# Patient Record
Sex: Female | Born: 1970 | Race: White | Hispanic: No | Marital: Married | Smoking: Never smoker
Health system: Southern US, Community
[De-identification: ages and names within clinical notes are randomized; demographics above are authoritative.]

## PROBLEM LIST (undated history)

## (undated) DIAGNOSIS — E669 Obesity, unspecified: Secondary | ICD-10-CM

## (undated) DIAGNOSIS — E049 Nontoxic goiter, unspecified: Secondary | ICD-10-CM

## (undated) DIAGNOSIS — E063 Autoimmune thyroiditis: Secondary | ICD-10-CM

## (undated) DIAGNOSIS — E7801 Familial hypercholesterolemia: Secondary | ICD-10-CM

## (undated) DIAGNOSIS — R5383 Other fatigue: Secondary | ICD-10-CM

## (undated) DIAGNOSIS — R7303 Prediabetes: Secondary | ICD-10-CM

## (undated) DIAGNOSIS — M549 Dorsalgia, unspecified: Secondary | ICD-10-CM

## (undated) DIAGNOSIS — R1013 Epigastric pain: Secondary | ICD-10-CM

## (undated) HISTORY — DX: Other fatigue: R53.83

## (undated) HISTORY — DX: Prediabetes: R73.03

## (undated) HISTORY — DX: Autoimmune thyroiditis: E06.3

## (undated) HISTORY — DX: Epigastric pain: R10.13

## (undated) HISTORY — PX: DILATION AND CURETTAGE OF UTERUS: SHX78

## (undated) HISTORY — DX: Familial hypercholesterolemia: E78.01

## (undated) HISTORY — DX: Nontoxic goiter, unspecified: E04.9

## (undated) HISTORY — DX: Dorsalgia, unspecified: M54.9

## (undated) HISTORY — PX: NOSE SURGERY: SHX723

## (undated) HISTORY — DX: Obesity, unspecified: E66.9

---

## 2004-01-01 ENCOUNTER — Encounter: Admission: RE | Admit: 2004-01-01 | Discharge: 2004-03-31 | Payer: Self-pay | Admitting: Internal Medicine

## 2007-05-21 ENCOUNTER — Encounter: Admission: RE | Admit: 2007-05-21 | Discharge: 2007-05-21 | Payer: Self-pay | Admitting: "Endocrinology

## 2007-05-21 ENCOUNTER — Ambulatory Visit: Payer: Self-pay | Admitting: "Endocrinology

## 2007-09-01 ENCOUNTER — Ambulatory Visit: Payer: Self-pay | Admitting: "Endocrinology

## 2007-12-30 ENCOUNTER — Ambulatory Visit: Payer: Self-pay | Admitting: "Endocrinology

## 2008-05-24 ENCOUNTER — Ambulatory Visit: Payer: Self-pay | Admitting: "Endocrinology

## 2008-10-03 ENCOUNTER — Ambulatory Visit: Payer: Self-pay | Admitting: "Endocrinology

## 2009-03-21 ENCOUNTER — Ambulatory Visit: Payer: Self-pay | Admitting: "Endocrinology

## 2009-07-12 ENCOUNTER — Ambulatory Visit: Payer: Self-pay | Admitting: "Endocrinology

## 2009-11-29 ENCOUNTER — Ambulatory Visit: Payer: Self-pay | Admitting: "Endocrinology

## 2010-03-11 ENCOUNTER — Ambulatory Visit: Payer: Self-pay | Admitting: "Endocrinology

## 2010-07-26 ENCOUNTER — Ambulatory Visit: Payer: Self-pay | Admitting: "Endocrinology

## 2010-11-26 ENCOUNTER — Encounter: Payer: Self-pay | Admitting: *Deleted

## 2010-11-26 ENCOUNTER — Other Ambulatory Visit: Payer: Self-pay | Admitting: *Deleted

## 2010-11-26 DIAGNOSIS — R7303 Prediabetes: Secondary | ICD-10-CM | POA: Insufficient documentation

## 2010-11-26 DIAGNOSIS — E669 Obesity, unspecified: Secondary | ICD-10-CM

## 2010-11-26 DIAGNOSIS — E038 Other specified hypothyroidism: Secondary | ICD-10-CM

## 2010-12-19 ENCOUNTER — Encounter: Payer: Self-pay | Admitting: "Endocrinology

## 2010-12-19 ENCOUNTER — Ambulatory Visit (INDEPENDENT_AMBULATORY_CARE_PROVIDER_SITE_OTHER): Payer: BC Managed Care – PPO | Admitting: "Endocrinology

## 2010-12-19 VITALS — BP 125/80 | HR 69 | Wt 229.0 lb

## 2010-12-19 DIAGNOSIS — R1013 Epigastric pain: Secondary | ICD-10-CM | POA: Insufficient documentation

## 2010-12-19 DIAGNOSIS — R5381 Other malaise: Secondary | ICD-10-CM

## 2010-12-19 DIAGNOSIS — R5383 Other fatigue: Secondary | ICD-10-CM

## 2010-12-19 DIAGNOSIS — E038 Other specified hypothyroidism: Secondary | ICD-10-CM

## 2010-12-19 DIAGNOSIS — E063 Autoimmune thyroiditis: Secondary | ICD-10-CM | POA: Insufficient documentation

## 2010-12-19 DIAGNOSIS — R7309 Other abnormal glucose: Secondary | ICD-10-CM

## 2010-12-19 DIAGNOSIS — E669 Obesity, unspecified: Secondary | ICD-10-CM

## 2010-12-19 DIAGNOSIS — E7801 Familial hypercholesterolemia: Secondary | ICD-10-CM | POA: Insufficient documentation

## 2010-12-19 DIAGNOSIS — M549 Dorsalgia, unspecified: Secondary | ICD-10-CM | POA: Insufficient documentation

## 2010-12-19 DIAGNOSIS — R7303 Prediabetes: Secondary | ICD-10-CM

## 2010-12-19 DIAGNOSIS — IMO0001 Reserved for inherently not codable concepts without codable children: Secondary | ICD-10-CM

## 2010-12-19 DIAGNOSIS — E049 Nontoxic goiter, unspecified: Secondary | ICD-10-CM

## 2010-12-19 LAB — POCT GLYCOSYLATED HEMOGLOBIN (HGB A1C): Hemoglobin A1C: 4.6

## 2010-12-19 LAB — T3, FREE: T3, Free: 2.3 pg/mL (ref 2.3–4.2)

## 2010-12-19 LAB — TSH: TSH: 4.755 u[IU]/mL — ABNORMAL HIGH (ref 0.350–4.500)

## 2010-12-19 LAB — GLUCOSE, POCT (MANUAL RESULT ENTRY): POC Glucose: 77

## 2010-12-19 NOTE — Patient Instructions (Signed)
Please have TFTs done today.

## 2010-12-22 NOTE — Progress Notes (Signed)
CC: FU of hypothyroid, obesity, hypertension, edema, dyspepsia, pre-diabetes  A. HPI: 40 y.o. white woman  1. She was given a diagnosis of hyperlipidemia about 25 years ago. In the past few years that I've been her endocrinologist she has firmly but politely declined to take any lipid-lowering agents. She was slender until about 1998, then began gaining weight. She developed hypothyroidism secondary to Hashimoto's Thyroiditis in the 2007-2008 period. She started Synthroid in October 2008. 2. Her last PSSG visit ws on 12.16.11. Since then she has had a lot of problems with her low back pain and left leg numbness. She is currently being followed by at least one specialist. She states that her back pain precludes her from any exercise. 3. PROS: Constitutional: The patient feels okay. Her back pains are debilitating. Eyes: Vision is good. There are no significant eye complaints. Neck: The patient has had a few episodes of soreness above the thyroid gland on the right side. She did have some hoarseness for a week, presumably due to pharyngitis. A doctor put her on antibiotics and the symptoms resolved. Heart: The patient has no complaints of palpitations, irregular heat beats, chest pain, or chest pressure. Gastrointestinal: Bowel movents seem normal. The patient has some occasional problems with acid indigestion and increased belly hunger. She has no complaints acid reflux, stomach aches or pains, diarrhea, or constipation. Legs: Muscle mass and strength seem normal. Her left leg remains numb due to her LBP problems. No edema is noted. Feet: There are no obvious foot problems. There are no complaints of numbness, tingling, burning, or pain.No edema is noted. GYN: Her menstrual period began yesterday.  PMFSH: 1. She and her son will travel back to [place] for 6 weeks soon.  ROS: Ms. Bunkley had no other significant issues involving any of her other body systems.  PHYSICALEXAM: BP 125/80  Pulse 69  Wt  229 lb (103.874 kg) Constitutional: The patient looks quite obese and looks like her back is painful when she walks or sits down or gets up from a chair. She appears to be emotionally well.  Eyes: There is no arcus or proptosis. Pupils are equally round and reactive to light. Mouth: The oropharynx appears normal. The tongue appears normal. There is normal oral moisture. There is no obvious gingivitis. Neck: There are no bruits present. The thyroid gland appears enlarged/ normal in size. The thyroid gland is approximately 20+ grams in size. The consistency of the thyroid gland is normal. There is no thyroid tenderness to palpation. Lungs: The lungs are clear. Air movement is good. Heart: The heart rhythm and rate appear normal. Heart sounds S1 and S2 are normal. I do not appreciate any pathologic heart murmurs. Abdomen: The abdominal size is enlarged. Bowel sounds are normal. The abdomen is soft, but menstrual tenderness is present.  Arms: Muscle mass appears appropriate for age and gender. Hands: There is no obvious tremor. Phalangeal and metacarpophalangeal joints appear normal. Palms are normal. Legs: Muscle mass appears appropriate for age and gender. There is no edema.  Neurologic: Muscle strength is normal for age and gender in both the upper and the lower extremities. Muscle tone appears normal. Sensation to touch is normal in the right leg and feet, but decreased in the left leg.  ASSESSMENT: 1. Hypothyroid: She was euthyroid on her most recent set of TFTs. 2. Obesity: It has become more difficult for her to exercise since she developed LBP and what appears to be left sciatica. She has not, however, cut back  sufficiently on her food intake to compensate. 3. Edema: There was no evidence of edema today. 4. Hypertension: Her systolic BP was acceptable today, but her diastolic BP is still a bit high, but pretty typical for her weight. I hope that when she returns to [place] she will feel more like  walking when she is around her friends. 5. Dyspepsia: overall this is well-controlled. 6. Pre-diabetes: Although she is not exercising, her HbA1c remains well within normal limits.  PLAN: 1. Try to increase walking and swimming when she returns to [place]. 2. Try to cut back on high carb items, such as starches. 3. Follow-up in about three months upon her return from [place].

## 2011-05-12 ENCOUNTER — Ambulatory Visit: Payer: BC Managed Care – PPO | Admitting: "Endocrinology

## 2011-07-22 ENCOUNTER — Encounter: Payer: Self-pay | Admitting: "Endocrinology

## 2011-07-22 ENCOUNTER — Ambulatory Visit (INDEPENDENT_AMBULATORY_CARE_PROVIDER_SITE_OTHER): Payer: BC Managed Care – PPO | Admitting: "Endocrinology

## 2011-07-22 ENCOUNTER — Other Ambulatory Visit: Payer: Self-pay | Admitting: "Endocrinology

## 2011-07-22 VITALS — BP 122/83 | HR 74 | Wt 230.4 lb

## 2011-07-22 DIAGNOSIS — K3189 Other diseases of stomach and duodenum: Secondary | ICD-10-CM

## 2011-07-22 DIAGNOSIS — E559 Vitamin D deficiency, unspecified: Secondary | ICD-10-CM

## 2011-07-22 DIAGNOSIS — E063 Autoimmune thyroiditis: Secondary | ICD-10-CM

## 2011-07-22 DIAGNOSIS — L659 Nonscarring hair loss, unspecified: Secondary | ICD-10-CM

## 2011-07-22 DIAGNOSIS — R7309 Other abnormal glucose: Secondary | ICD-10-CM

## 2011-07-22 DIAGNOSIS — E049 Nontoxic goiter, unspecified: Secondary | ICD-10-CM

## 2011-07-22 DIAGNOSIS — R7303 Prediabetes: Secondary | ICD-10-CM

## 2011-07-22 DIAGNOSIS — E038 Other specified hypothyroidism: Secondary | ICD-10-CM

## 2011-07-22 DIAGNOSIS — R1013 Epigastric pain: Secondary | ICD-10-CM

## 2011-07-22 MED ORDER — LANSOPRAZOLE 30 MG PO CPDR: DELAYED_RELEASE_CAPSULE | ORAL | Status: DC

## 2011-07-22 NOTE — Progress Notes (Addendum)
CC: FU of hypothyroid, Hashimoto's disease, goiter, obesity, hypertension, edema, dyspepsia, pre-diabetes, oligomenorrhea, and fatigue  HISTORY OF PRESENT ILLNESS: 40 y.o. white woman  1. She was given a diagnosis of hyperlipidemia about 25 years ago. In the past few years that I've been her endocrinologist she has firmly but politely declined to take any lipid-lowering agents. She was slender until about 1998, then began gaining weight. She developed hypothyroidism secondary to Hashimoto's Thyroiditis in the 2007-2008 period. She started Synthroid in October 2008. 2. Her last PSSG visit was on 12/19/10. On 03/08/11 she fractured her right fifth metatarsal. She was in a cast for 6 weeks, then a boot, and also had to use a scooter for a while. In October she began to have episodes of diarrhea, upset stomach, and nausea. She was put on probiotics and antacids. Her symptoms have improved. She's begun to lose some hair recently. She remains on Synthroid 137 mcg per day, but does miss doses occasionally. She is also taking metformin, 500 mg twice daily and vitamin D daily. 3. Pertinent Review of Systems: Constitutional:  She feels sluggish .Her whole body feels bloated. Eyes: Vision is good. There are no significant eye complaints. Neck: The patient has had no anterior neck symptoms recently.  Heart: The patient has no complaints of palpitations, irregular heat beats, chest pain, or chest pressure. Gastrointestinal:  She is "hungry" each morning. Bowel movents seem normal. The patient has some occasional problems with acid indigestion and  upset stomach as noted above. She has no complaints acid reflux, stomach aches or pains, or constipation. Legs: Muscle mass and strength seem normal. Her left leg has not been  bothering her recently. No edema is noted. Feet: She is still having some pain at the site of her right foot fracture. There are no other obvious foot problems. There are no complaints of numbness,  tingling, burning, or pain. No edema is noted. GYN: LMP was  07/17/11. Menstrual cycles have been regular. She saw her GYN because she was having some bloody discharge. An infection was diagnosed and she was treated with antibiotics.The discharge resolved.   PAST MEDICAL, FAMILY, AND SOCIAL HISTORY: 1. Work and family: She is still working as a Editor, commissioning. 2. Activities: She enjoyed the trip that she and Derek M[place] took to [place] over the summer. She has had very little physical activity since then. 3. Primary care provider: Dr. Josiah Lobo, MD  REVIEW OFSYSTEMS: She has complained recently of some tightness and pain in the nuchal cords of her posterior neck. When the tightness gets worse, it causes tension headaches. She also complains of some numbness numbness that begins in her neck and runs down the left arm and down into her left hand. Ms. Sparlin had no other significant issues involving any of her other body systems.  PHYSICALEXAM: BP 122/83  Pulse 74  Wt 230 lb 6.4 oz (104.509 kg) Constitutional: The patient is alert and bright. She is clearly obese, but otherwise looks healthy. She appears to be emotionally well.  Eyes: There is no arcus or proptosis. Ocular moisture appears normal. Mouth: The oropharynx appears normal. The tongue appears normal. There is normal oral moisture.  Neck: There are no bruits present. The thyroid gland appears normal in size. The thyroid gland is approximately 20-25 grams in size. The consistency of the thyroid gland is normal. There is no thyroid tenderness to palpation. Lungs: The lungs are clear. Air movement is good. Heart: The heart rhythm and rate appear normal. Heart sounds  S1 and S2 are normal. I do not appreciate any pathologic heart murmurs. Abdomen: The abdomen  is enlarged. Bowel sounds are normal. The abdomen is soft, but menstrual tenderness is present.  Arms: Muscle mass appears appropriate for age and gender. Hands: There is no obvious  tremor. Phalangeal and metacarpophalangeal joints appear normal. Palms are normal. Legs: Muscle mass appears appropriate for age and gender. There is no edema.  Neurologic: Muscle strength is normal for age and gender in both the upper and the lower extremities. Muscle tone appears normal. Sensation to touch is normal in the legs.  LAB DATA: Hemoglobin A1c is 5.2% today.  ASSESSMENT:  1. Hypothyroid: Needs TFTs again. 2. Obesity: Her weight is essentially the same as it was in May. 3. Goiter: Her thyroid gland is unchanged in size from May. 4. Hypertension: Her BP is unchanged. Her systolic BP remains normal, but her diastolic BP remains elevated. Exercise would help. 5. Dyspepsia: This problem is essentially unchanged. 6. Pre-diabetes: Her A1c is still within normal, but is increasing . 7. Hashimoto's disease: Her thyroiditis is clinically quiescent.  8. Hair loss: This could be due to hypothyroidism or to one or more vitamin deficiencies.   PLAN:  1. Diagnostic: TFTs and CMP 2. Therapeutic: Prevacid, 30 mg, twice daily 3. Patient Education: Be really careful with starch and sugar intake. 4.. Follow-up in about four months.  Level of Service: This visit lasted in excess of 40 minutes. More than 50% of the visit was devoted to counseling.  David Stall

## 2011-07-22 NOTE — Patient Instructions (Signed)
Followup in 4 months. If you have not heard from [place] in 3 weeks, please call to verify lab results.

## 2011-07-23 LAB — COMPREHENSIVE METABOLIC PANEL
ALT: 18 U/L (ref 0–35)
Albumin: 4.4 g/dL (ref 3.5–5.2)
Alkaline Phosphatase: 52 U/L (ref 39–117)
Potassium: 4 mEq/L (ref 3.5–5.3)
Sodium: 137 mEq/L (ref 135–145)
Total Bilirubin: 0.4 mg/dL (ref 0.3–1.2)
Total Protein: 7.5 g/dL (ref 6.0–8.3)

## 2011-07-23 LAB — T3, FREE: T3, Free: 2.6 pg/mL (ref 2.3–4.2)

## 2011-08-02 ENCOUNTER — Telehealth: Payer: Self-pay | Admitting: "Endocrinology

## 2011-08-02 DIAGNOSIS — E063 Autoimmune thyroiditis: Secondary | ICD-10-CM

## 2011-08-02 MED ORDER — LEVOTHYROXINE SODIUM 150 MCG PO TABS: ORAL_TABLET | ORAL | Status: DC

## 2011-09-01 NOTE — Telephone Encounter (Signed)
Last TFTs were a bit low. Will send in change of scrip for Synthroid to 150 mcg/day.

## 2011-09-16 ENCOUNTER — Other Ambulatory Visit: Payer: Self-pay | Admitting: *Deleted

## 2011-09-16 DIAGNOSIS — R7303 Prediabetes: Secondary | ICD-10-CM

## 2011-09-16 MED ORDER — METFORMIN HCL 500 MG PO TABS: 500.0000 mg | ORAL_TABLET | Freq: Two times a day (BID) | ORAL | Status: DC

## 2011-10-14 ENCOUNTER — Telehealth: Payer: Self-pay | Admitting: "Endocrinology

## 2011-10-14 DIAGNOSIS — D6859 Other primary thrombophilia: Secondary | ICD-10-CM

## 2011-10-14 DIAGNOSIS — E063 Autoimmune thyroiditis: Secondary | ICD-10-CM

## 2011-10-14 NOTE — Telephone Encounter (Signed)
1. Patient called to inform me that she is 7-[redacted] weeks pregnant. She had a previous molar pregnancy, so she will see OB on 10/16/11.  2. She wants to have TFTs drawn at the [facility] lab [facility]. She would like [place] to fax a lab request to that lab.  3. I reviewed her meds. She is not taking anything that is contraindicated for pregnancy. The OBs may want to keep her on metformin since she is at high risk of developing GDM.  4. She is due for a FU appointment in April.

## 2011-10-17 ENCOUNTER — Other Ambulatory Visit: Payer: Self-pay | Admitting: "Endocrinology

## 2011-10-18 LAB — COMPREHENSIVE METABOLIC PANEL
ALT: 44 U/L — ABNORMAL HIGH (ref 0–35)
CO2: 23 mEq/L (ref 19–32)
Calcium: 9.3 mg/dL (ref 8.4–10.5)
Chloride: 102 mEq/L (ref 96–112)
Creat: 0.79 mg/dL (ref 0.50–1.10)
Sodium: 136 mEq/L (ref 135–145)
Total Bilirubin: 0.2 mg/dL — ABNORMAL LOW (ref 0.3–1.2)

## 2011-10-18 LAB — VITAMIN D 25 HYDROXY (VIT D DEFICIENCY, FRACTURES): Vit D, 25-Hydroxy: 24 ng/mL — ABNORMAL LOW (ref 30–89)

## 2011-10-18 LAB — CBC
HCT: 38.7 % (ref 36.0–46.0)
MCHC: 32.8 g/dL (ref 30.0–36.0)
Platelets: 193 10*3/uL (ref 150–400)
RDW: 14.1 % (ref 11.5–15.5)

## 2011-10-18 LAB — TSH: TSH: 3.791 u[IU]/mL (ref 0.350–4.500)

## 2011-10-18 LAB — T4, FREE: Free T4: 1.38 ng/dL (ref 0.80–1.80)

## 2011-10-20 LAB — PTH, INTACT AND CALCIUM
Calcium, Total (PTH): 9.3 mg/dL (ref 8.4–10.5)
PTH: 18.8 pg/mL (ref 14.0–72.0)

## 2011-10-21 LAB — VITAMIN D 1,25 DIHYDROXY
Vitamin D2 1, 25 (OH)2: <8 pg/mL
Vitamin D3 1, 25 (OH)2: 108 pg/mL

## 2011-10-28 ENCOUNTER — Other Ambulatory Visit: Payer: Self-pay | Admitting: *Deleted

## 2011-10-28 MED ORDER — LEVOTHYROXINE SODIUM 150 MCG PO TABS: ORAL_TABLET | ORAL | Status: DC

## 2011-11-24 ENCOUNTER — Ambulatory Visit (INDEPENDENT_AMBULATORY_CARE_PROVIDER_SITE_OTHER): Payer: BC Managed Care – PPO | Admitting: "Endocrinology

## 2011-11-24 ENCOUNTER — Encounter: Payer: Self-pay | Admitting: "Endocrinology

## 2011-11-24 VITALS — BP 142/88 | HR 85 | Wt 234.6 lb

## 2011-11-24 DIAGNOSIS — R7303 Prediabetes: Secondary | ICD-10-CM

## 2011-11-24 DIAGNOSIS — E063 Autoimmune thyroiditis: Secondary | ICD-10-CM

## 2011-11-24 DIAGNOSIS — R7309 Other abnormal glucose: Secondary | ICD-10-CM

## 2011-11-24 DIAGNOSIS — K3189 Other diseases of stomach and duodenum: Secondary | ICD-10-CM

## 2011-11-24 DIAGNOSIS — E038 Other specified hypothyroidism: Secondary | ICD-10-CM

## 2011-11-24 DIAGNOSIS — I10 Essential (primary) hypertension: Secondary | ICD-10-CM

## 2011-11-24 DIAGNOSIS — E049 Nontoxic goiter, unspecified: Secondary | ICD-10-CM

## 2011-11-24 DIAGNOSIS — E669 Obesity, unspecified: Secondary | ICD-10-CM

## 2011-11-24 DIAGNOSIS — R1013 Epigastric pain: Secondary | ICD-10-CM

## 2011-11-24 DIAGNOSIS — O2690 Pregnancy related conditions, unspecified, unspecified trimester: Secondary | ICD-10-CM

## 2011-11-24 NOTE — Progress Notes (Signed)
CC: FU of hypothyroid, Hashimoto's disease, goiter, obesity, hypertension, edema, dyspepsia, pre-diabetes, hyperlipidemia, oligomenorrhea, fatigue, intrauterine pregnancy.  HISTORY OF PRESENT ILLNESS: 41 y.o. Caucasian woman  1. The patient was given a diagnosis of hyperlipidemia about 25 years ago. In the past few years that I've been her endocrinologist she has firmly but politely declined to take any lipid-lowering agents. She was slender until about 1998, then began gaining weight. She developed hypothyroidism secondary to Hashimoto's Thyroiditis in the 2007-2008 period. She started Synthroid in October 2008. She has  been taking metformin, 500 mg,twice daily for treatment of obesity and pre-diabetes. She has also been taking Prevacid for treatment of dyspepsia. 2. Her last PSSG visit was on 07/22/11. She remains on Synthroid 150 mcg per day. She is now [redacted] weeks pregnant. Her EDC is 05/28/12. She stopped metformin, 500 mg twice daily on March 27th, in preparation for an OGTT which she will have on 12/08/11. She had one OGTT already on 11/21/11. She is taking prenatal vitamins.  3. Pertinent Review of Systems: Constitutional:  She feels "OK and pregnant". Her breasts are swollen and itch all the time. Her morning sickness is improving. Eyes: Vision is good. There are no significant eye complaints. Neck: The patient has had no anterior neck symptoms recently.  Heart: The patient has no complaints of palpitations, irregular heat beats, chest pain, or chest pressure. Gastrointestinal:  Gassy vegetables cause cramping and pains in the mid left abdomen and more recently in the LUQ.  Bowel movents seem normally soft. The patient has some occasional problems with acid indigestion and  upset stomach as noted above, but Prevacid pretty much controls these symptoms. She has no complaints of acid reflux, stomach aches or pains, or constipation. Arms: Arms sometimes get numb if she has pain in the posterior  neck Legs: Muscle mass and strength seem normal. Her left leg has not been  bothering her recently. No edema is noted. Feet: She is still having some pain at the site of her right foot fracture. There are no other obvious foot problems. There are no complaints of numbness, tingling, burning, or pain. No edema is noted. GYN: As above. She craves carbs. Meat is repulsive.  PAST MEDICAL, FAMILY, AND SOCIAL HISTORY: 1. Work and family: She is still working as a Editor, commissioning at [employer]. 2. Activities: She is looking forward to visiting [place] again soon. 3. Primary care provider: [facility] (Dr. Josiah Lobo, MD) 4. OB/GYN: Dr. Clint Lipps, [facility], phone [phone], fax [phone]  REVIEW OF SYSTEMS: She needs to learn cervical spine stretching exercises.   PHYSICAL EXAM: BP 142/88  Pulse 85  Wt 234 lb 9.6 oz (106.414 kg) Constitutional: The patient is alert and bright. She is clearly obese, but otherwise looks healthy. She appears to be emotionally well.  Eyes: There is no arcus or proptosis. Ocular moisture appears normal. Mouth: The oropharynx appears normal. The tongue appears normal. There is normal oral moisture.  Neck: There are no bruits present. The thyroid gland appears normal in size. The thyroid gland is approximately 20-25 grams in size. The consistency of the thyroid gland is normal. The mid-left lobe is tender today.   Lungs: The lungs are clear. Air movement is good. Heart: The heart rhythm and rate appear normal. Heart sounds S1 and S2 are normal. I do not appreciate any pathologic heart murmurs. Abdomen: The abdomen  is enlarged. Bowel sounds are normal. The abdomen is soft. Some mid-left abdominal tenderness is  present at times.   Arms: Muscle mass appears appropriate for age and gender. Hands: There is no obvious tremor. Phalangeal and metacarpophalangeal joints appear normal. Palms are normal. Legs: Muscle mass appears appropriate for  age and gender. There is no edema.  Neurologic: Muscle strength is normal for age and gender in both the upper and the lower extremities. Muscle tone appears normal. Sensation to touch is normal in the legs.  LAB DATA: Hemoglobin A1c is 5.0% today, compared with 5.2% at last visit..  ASSESSMENT:  1. Hypothyroid: Needs TFTs every 2 months. 2. Obesity: Her weight will likely increase during the pregnancy. She can get rid of some of the excess fat by walking. 3. Goiter: Her thyroid gland is unchanged in size from December. 4. Hypertension: Her BP is worse. Walking will help. Weight was reportedly normal last Friday. She is taking Claritin-D or regular Claritin.  5. Dyspepsia: This problem is essentially unchanged. 6. Pre-diabetes: Her A1c is better.  7. Hashimoto's disease: Her thyroiditis is clinically active today.  8. Hair loss: This is not much of a problem now.  9. Intrauterine pregnancy: She is 14 weeks in gestation. Because she is already hypothyroid, it is essential to maintain her Synthroid during the pregnancy, so that her placenta will be able to grow and develop as required.   PLAN:  1. Diagnostic: TFTs and CMP if not already done by Dr. Allena Katz. Repeat TFTs every two months. 2. Therapeutic: Continue generic Prevacid, 30 mg, twice daily. Adjust synthroid doses as needed.  3. Patient Education: Be really careful with starch and sugar intake. Do cervical stretching exercises 3-4 times daily. 4. Follow-up in about three months.  Level of Service: This visit lasted in excess of 40 minutes. More than 50% of the visit was devoted to counseling.  David Stall

## 2011-11-24 NOTE — Patient Instructions (Signed)
Follow-up visit in two months. Please fill out a request to obtain labs from Dr. Eliane Decree office.

## 2011-11-25 ENCOUNTER — Other Ambulatory Visit: Payer: Self-pay | Admitting: *Deleted

## 2011-11-25 DIAGNOSIS — E038 Other specified hypothyroidism: Secondary | ICD-10-CM

## 2012-01-02 LAB — T3, FREE: T3, Free: 2.4 pg/mL (ref 2.3–4.2)

## 2012-02-04 ENCOUNTER — Other Ambulatory Visit: Payer: Self-pay | Admitting: *Deleted

## 2012-02-04 DIAGNOSIS — E038 Other specified hypothyroidism: Secondary | ICD-10-CM

## 2012-02-19 ENCOUNTER — Telehealth: Payer: Self-pay | Admitting: "Endocrinology

## 2012-02-19 NOTE — Telephone Encounter (Signed)
The patient's husband called to ask two questions. His wife is vacationing in [place] and is pregnant. 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 She has recently been having some hypoglycemic symptoms in the AM. Her BG this AM was 78, which has been fairly typical for her recently.   1. Her first question is what should she do about the hypoglycemia. I told Mr. Velora Mediate that it is very common for women in his wife's stage of pregnancy to develop hypoglycemia during the night and early morning hours. The metformin may aggravate that tendency. A BG of 78, however, is not really a problem. What  he needs to do, however, is call his wife's OB. During pregnancy the OBs make all the decisions about DM care. I do not. The OB will want about a week's worth of BG data in order to make an informed decision. His wife can get that information to him quite easily.   2. She had her most recent set of TFTs done in late May, just before going to [place]. She was euthyroid then, in the lower third of the normal range. She will return in early August, when we had planned to obtain her next set of TFTs. Does she need to have a set done in [place] in July, or can she wait until early August? I told him that as long as she's feeling well she can certainly wait until early August. That was what we had planned to do anyway. If she gets unusually tired, however, she may need to have a set of TFTs performed in [place].  David Stall

## 2012-03-09 ENCOUNTER — Ambulatory Visit: Payer: BC Managed Care – PPO | Admitting: "Endocrinology

## 2012-03-22 LAB — T4, FREE: Free T4: 1.26 ng/dL (ref 0.80–1.80)

## 2012-03-22 LAB — T3, FREE: T3, Free: 2.4 pg/mL (ref 2.3–4.2)

## 2012-03-24 ENCOUNTER — Ambulatory Visit: Payer: BC Managed Care – PPO | Admitting: "Endocrinology

## 2012-04-21 ENCOUNTER — Telehealth: Payer: Self-pay | Admitting: "Endocrinology

## 2012-04-21 NOTE — Telephone Encounter (Signed)
Patient returned my call.  1. Her EDC in 05/28/12. Her current Synthroid dose is 150 mg/day. Her metformin dose was 500 mg, twice daily, but her OB took her off the medication based upon her recent OGT. She will be breast feeding. Her pre-pregnancy Synthroid dose was 137 mcg/day. Her pre-pregnancy metformin dose was 500 mg, twice daily. She wants to know what to do with her meds after delivery. 2. After delivery, she should resume Synthroid 137 mcg/day. Because she will be breast feeding, she does not need to resume the metformin. She should check her BGs once daily, either before breakfast or before supper.  She needs to call me two weeks after delivery to discus the BG checks.  She needs to see me about two months after delivery.  David Stall

## 2012-04-21 NOTE — Telephone Encounter (Signed)
Patient left a VM message that she had questions about what to do with her Synthroid and metformin doses after the baby is born. I tried to reach her on cell and at home, but she was not available. I left VM messages asking her to call me back via the answering service or on cell. David Stall

## 2012-06-04 NOTE — Addendum Note (Signed)
Addended by: David Stall on: 06/04/2012 06:56 PM   Modules accepted: Orders

## 2012-06-07 ENCOUNTER — Other Ambulatory Visit: Payer: Self-pay | Admitting: *Deleted

## 2012-06-07 DIAGNOSIS — E038 Other specified hypothyroidism: Secondary | ICD-10-CM

## 2012-06-07 MED ORDER — LEVOTHYROXINE SODIUM 137 MCG PO TABS: ORAL_TABLET | ORAL | Status: DC

## 2012-06-07 NOTE — Telephone Encounter (Signed)
I received a voice mail from [place] today. She delivered a baby girl on 05/20/12 and is tired but doing fine.  Ana Murphy requests the following: 1. Needs refill on Synthroid. She was on 150 mcg 1 tab daily up until her 6th-7th month of pregnancy.  At that time Dr. Fransico Michael decreased her dose to 137 mcg 1 tab daily. She only has 2 tabs left of Synthroid 137 mcg. 2. Does she need to have labs drawn before we refill her medication? 3. [employer], their insurance co. Is mandating that they use [employer] as their DME for [name]'s insulin pump supplies. They need help doing this.  Spoke with [place]. Per Dr. Fransico Michael: 1. We will give her refills on the Synthroid, Brand Name Medically Necessary, 137 mcg, 1 tab daily. E-Scribed. 2. Lab forms for TSH, Free T4 and Free T3 will be faxed to her husband's office at [phone]. She will have them drawn at [facility] in [place] 3. [employer] now has an exclusive contract with [facility] for insulin pumps and pump supplies.  [name], [name]'s father has spoken with them she thinks.  I will email her the information I need to send orders to [facility].

## 2012-06-15 ENCOUNTER — Telehealth: Payer: Self-pay | Admitting: "Endocrinology

## 2012-06-15 NOTE — Telephone Encounter (Signed)
1. I called the patient to discuss her TFTs and BGs.  A. She started the 150 mcg/day dose of Synthroid in August. She delivered the baby October 10th. She switched to Synthroid 137 mcg/day on 05/23/12.   B. She feels very full of energy, the best that she has felt in 10 years. She is only getting about 2-3 hours of sleep at a time.   C. Her lab results from 06/09/12: TSH was  0.143, free T4 was 1.71, free T3 was 3.4. Taken together, she was hyperthyroid.   D. Continue on Synthroid 137 mcg/day as she is. Repeat the TFTs on or about 07/23/12.  ENoreene Larsson will see me on 07/26/12. She would like for me to see her that day as well.  F. 06/08/12: Before breakfast: 82       06/09/12: Before dinner: 82       06/10/12: Before breakfast: 91       06/11/12: One hour after dinner: 133       06/12/12: Before breakfast: 89  F. BGs are doing well now while she is breastfeeding. We need to reassess BGs 1-2 months after she stops breastfeeding.   David Stall

## 2012-07-13 ENCOUNTER — Other Ambulatory Visit: Payer: Self-pay | Admitting: *Deleted

## 2012-07-13 DIAGNOSIS — E038 Other specified hypothyroidism: Secondary | ICD-10-CM

## 2012-07-22 LAB — TSH: TSH: 0.101 u[IU]/mL — ABNORMAL LOW (ref 0.350–4.500)

## 2012-07-22 LAB — HEMOGLOBIN A1C: Hgb A1c MFr Bld: 5.6 % (ref <5.7)

## 2012-07-22 LAB — T4, FREE: Free T4: 1.51 ng/dL (ref 0.80–1.80)

## 2012-07-26 ENCOUNTER — Ambulatory Visit (INDEPENDENT_AMBULATORY_CARE_PROVIDER_SITE_OTHER): Payer: BC Managed Care – PPO | Admitting: "Endocrinology

## 2012-08-13 ENCOUNTER — Encounter: Payer: Self-pay | Admitting: "Endocrinology

## 2012-08-13 ENCOUNTER — Ambulatory Visit (INDEPENDENT_AMBULATORY_CARE_PROVIDER_SITE_OTHER): Payer: BC Managed Care – PPO | Admitting: "Endocrinology

## 2012-08-13 VITALS — BP 142/82 | HR 79 | Wt 241.5 lb

## 2012-08-13 DIAGNOSIS — E669 Obesity, unspecified: Secondary | ICD-10-CM

## 2012-08-13 DIAGNOSIS — E038 Other specified hypothyroidism: Secondary | ICD-10-CM

## 2012-08-13 DIAGNOSIS — R7303 Prediabetes: Secondary | ICD-10-CM

## 2012-08-13 DIAGNOSIS — R7309 Other abnormal glucose: Secondary | ICD-10-CM

## 2012-08-13 DIAGNOSIS — E049 Nontoxic goiter, unspecified: Secondary | ICD-10-CM

## 2012-08-13 DIAGNOSIS — I10 Essential (primary) hypertension: Secondary | ICD-10-CM

## 2012-08-13 DIAGNOSIS — E063 Autoimmune thyroiditis: Secondary | ICD-10-CM

## 2012-08-13 NOTE — Patient Instructions (Signed)
Follow up visit in 4 months. Please have repeat thyroid blood tests done in mid-February.

## 2012-08-13 NOTE — Progress Notes (Signed)
CC: FU of hypothyroid, Hashimoto's disease, goiter, obesity, hypertension, edema, dyspepsia, pre-diabetes, hyperlipidemia, oligomenorrhea, fatigue.  HISTORY OF PRESENT ILLNESS: 42 y.o. Caucasian woman  1. The patient was given a diagnosis of hyperlipidemia about 25 years ago. In the past few years that I've been her endocrinologist she has firmly but politely declined to take any lipid-lowering agents. She was slender until about 1998, then began gaining weight. She developed hypothyroidism secondary to Hashimoto's Thyroiditis in the 2007-2008 period. She started Synthroid in October 2008. She had  been taking metformin, 500 mg,twice daily for treatment of obesity and pre-diabetes. She has also been taking Prevacid for treatment of dyspepsia.  2. Her last PSSG visit was on 11/24/11. Her Synthroid dose was recently reduced to Synthroid 137 mcg per day six days per week, but only 1/2 pill on Sundays.  She remains off metformin, 500 mg twice daily. She delivered her baby on 05/20/12. She was evaluated for bilateral carpal tunnel symptoms by her PCP. She received bilateral steroid injections on 08/05/12. Her symptoms are better.   3. Pertinent Review of Systems: Constitutional:  She feels "fine". Her energy level is good. She is breast feeding.  Eyes: Vision is good. There are no significant eye complaints. Neck: The patient has had no anterior neck symptoms recently.  Heart: The patient has no complaints of palpitations, irregular heat beats, chest pain, or chest pressure. Gastrointestinal:  Bowel movents seem normally soft. She has no complaints of acid reflux, stomach aches or pains, or constipation. Arms: Arms sometimes get numb if she has pain in the posterior neck Legs: Muscle mass and strength seem normal. Her left leg has not been  bothering her recently. No edema is noted. Feet: She is still having some pain at the site of her previous right foot fracture. There are no other obvious foot problems.  She occasionally notes tingling in her feet when she is driving. She also has a knot on her right anteromedial calf. There are no other. complaints of numbness, tingling, burning, or pain. No edema is noted. GYN: No menses since delivery. She will probably start birth control soon.   PAST MEDICAL, FAMILY, AND SOCIAL HISTORY: 1. Work and family: She is still working as a Editor, commissioning at [employer]. She will return to work on Monday.  2. Activities: She is looking forward to visiting [place] again next Summer. . 3. Primary care provider: PA Armando Gang (Dr. Josiah Lobo, MD) 4. OB/GYN: Dr. Clint Lipps, [facility], phone [phone], fax [phone]  REVIEW OF SYSTEMS: She has no other issues involving her other body systems.    PHYSICAL EXAM: BP 142/82  Pulse 79  Wt 241 lb 8 oz (109.544 kg) Constitutional: The patient is alert and bright. She is clearly obese, but otherwise looks healthy. She appears to be emotionally well.  Eyes: There is no arcus or proptosis. Ocular moisture appears normal. Mouth: The oropharynx appears normal. The tongue appears normal. There is normal oral moisture.  Neck: There are no bruits present. The thyroid gland appears normal in size. The thyroid gland is approximately 18-20 grams in size. The consistency of the thyroid gland is normal. The thyroid gland is nontender today.    Lungs: The lungs are clear. Air movement is good. Heart: The heart rhythm and rate appear normal. Heart sounds S1 and S2 are normal. I do not appreciate any pathologic heart murmurs. Abdomen: The abdomen  is enlarged. Bowel sounds are normal. The abdomen is soft. Some mid-left abdominal  tenderness is present at times.   Arms: Muscle mass appears appropriate for age and gender. Hands: There is no obvious tremor. Phalangeal and metacarpophalangeal joints appear normal. Palms are normal. Legs: Muscle mass appears appropriate for age and gender. There is no edema. 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  Neurologic: Muscle strength is normal for age and gender in both the upper and the lower extremities. Muscle tone appears normal. Sensation to touch is normal in the legs.  LAB DATA: 07/22/12: HbA1c 5.6%, TSH 0.101, free T4 1.51, free T3 2.9 (on Synthroid 137 mcg/day)  ASSESSMENT:  1. Hypothyroid: She was hyperthyroid last month. We have since reduced her Synthroid dose. Needs TFTs in 2 months. 2. Obesity: Her weight increased during the pregnancy. She can get rid of some of the excess fat by walking and eating right. 3. Goiter: Her thyroid gland is smaller in size since April. 4. Hypertension: Her BP is still high. Walking will help. Weight loss will also help. I do not want to start antihypertensive meds while she is breast-feeding.  5. Dyspepsia: This problem has improved since delivery.  6. Pre-diabetes: Her A1c is within the upper limit of normal. better.  7. Hashimoto's disease: Her thyroiditis is clinically quiescent today.   8. Hair loss: This is more of a problem now.    PLAN:  1. Diagnostic: TFTs in 2 months.  2. Therapeutic: Continue Synthroid dose as is, but adjust as needed.   3. Patient Education: It's time to get back to eating right and exercising more. . 4. Follow-up: four months.  Level of Service: This visit lasted in excess of 40 minutes. More than 50% of the visit was devoted to counseling.  David Stall

## 2012-10-13 LAB — TSH: TSH: 3.479 u[IU]/mL (ref 0.350–4.500)

## 2012-10-13 LAB — T3, FREE: T3, Free: 2.4 pg/mL (ref 2.3–4.2)

## 2012-10-13 LAB — T4, FREE: Free T4: 1.35 ng/dL (ref 0.80–1.80)

## 2012-10-19 ENCOUNTER — Other Ambulatory Visit: Payer: Self-pay | Admitting: "Endocrinology

## 2012-10-19 DIAGNOSIS — E038 Other specified hypothyroidism: Secondary | ICD-10-CM

## 2012-11-30 ENCOUNTER — Other Ambulatory Visit: Payer: Self-pay | Admitting: *Deleted

## 2012-11-30 DIAGNOSIS — E038 Other specified hypothyroidism: Secondary | ICD-10-CM

## 2012-12-14 LAB — TSH: TSH: 4.018 u[IU]/mL (ref 0.350–4.500)

## 2012-12-14 LAB — T3, FREE: T3, Free: 2.4 pg/mL (ref 2.3–4.2)

## 2012-12-20 ENCOUNTER — Other Ambulatory Visit: Payer: Self-pay | Admitting: *Deleted

## 2012-12-20 DIAGNOSIS — E038 Other specified hypothyroidism: Secondary | ICD-10-CM

## 2012-12-20 MED ORDER — LEVOTHYROXINE SODIUM 150 MCG PO TABS: 150.0000 ug | ORAL_TABLET | Freq: Every day | ORAL | Status: DC

## 2012-12-28 ENCOUNTER — Ambulatory Visit (INDEPENDENT_AMBULATORY_CARE_PROVIDER_SITE_OTHER): Payer: BC Managed Care – PPO | Admitting: "Endocrinology

## 2012-12-28 ENCOUNTER — Encounter: Payer: Self-pay | Admitting: "Endocrinology

## 2012-12-28 VITALS — BP 123/79 | HR 71 | Wt 233.5 lb

## 2012-12-28 DIAGNOSIS — R7303 Prediabetes: Secondary | ICD-10-CM

## 2012-12-28 DIAGNOSIS — R7309 Other abnormal glucose: Secondary | ICD-10-CM

## 2012-12-28 DIAGNOSIS — L659 Nonscarring hair loss, unspecified: Secondary | ICD-10-CM

## 2012-12-28 DIAGNOSIS — E038 Other specified hypothyroidism: Secondary | ICD-10-CM

## 2012-12-28 DIAGNOSIS — E049 Nontoxic goiter, unspecified: Secondary | ICD-10-CM

## 2012-12-28 DIAGNOSIS — E063 Autoimmune thyroiditis: Secondary | ICD-10-CM

## 2012-12-28 DIAGNOSIS — I10 Essential (primary) hypertension: Secondary | ICD-10-CM

## 2012-12-28 DIAGNOSIS — E669 Obesity, unspecified: Secondary | ICD-10-CM

## 2012-12-28 LAB — POCT GLYCOSYLATED HEMOGLOBIN (HGB A1C): Hemoglobin A1C: 5.3

## 2012-12-28 NOTE — Patient Instructions (Signed)
Follow up visit in 3 months. Exercise for an hour per day.

## 2012-12-28 NOTE — Progress Notes (Signed)
CC: FU of hypothyroid, Hashimoto's disease, goiter, obesity, hypertension, edema, dyspepsia, pre-diabetes, hyperlipidemia, oligomenorrhea, fatigue.  HISTORY OF PRESENT ILLNESS: 42 y.o. Caucasian woman  1. The patient was given a diagnosis of hyperlipidemia about 25 years ago. In the past few years that I've been her endocrinologist she has firmly but politely declined to take any lipid-lowering agents. She was slender until about 1998, then began gaining weight. She developed hypothyroidism secondary to Hashimoto's Thyroiditis in the 2007-2008 period. She started Synthroid in October 2008. As she has lost more thyrocytes over time we have gradually increased her synthroid dose. She had  been taking metformin, 500 mg,twice daily for treatment of obesity and pre-diabetes, but that drug was discontinued during her pregnancy. She remains off the metformin while breast feeding. She has also been taking Prevacid for treatment of dyspepsia.  2. Her last PSSG visit was on 08/13/12. Her Synthroid dose was recently increased to 150 mcg/day. She remains off metformin, 500 mg twice daily. She delivered her baby on 05/20/12. She was evaluated for bilateral carpal tunnel symptoms by her PCP. She received bilateral steroid injections on 08/05/12. She wears her forearm braces at night. Her CTS symptoms are better.   3. Pertinent Review of Systems: Constitutional:  She feels "fine". Her energy level is good. She is still breast feeding. Her allergies are acting up.  Eyes: Vision is good. There are no significant eye complaints. Neck: The patient has had no anterior neck symptoms recently.  Heart: She occasionally notes a "skipped beat". The patient has no other complaints of palpitations, irregular heat beats, chest pain, or chest pressure. Gastrointestinal:  Bowel movents seem normally soft. She has no complaints of acid reflux, stomach aches or pains, or constipation. Arms: Arms sometimes get numb if she has pain in the  posterior neck Legs: Muscle mass and strength seem normal. Her left leg has not been  bothering her recently. No edema is noted. Feet: She is still having some pain at the site of her previous right foot fracture. There are no other obvious foot problems. She occasionally notes tingling in her feet when she is driving. She still has a knot on her right anteromedial calf. There are no other. complaints of numbness, tingling, burning, or pain. No edema is noted. GYN: No typical menses since delivery, but she had constant bleeding for a month several months ago. She started depo-Provera one month ago.    PAST MEDICAL, FAMILY, AND SOCIAL HISTORY: 1. Work and family: She is still working as a Editor, commissioning at [employer].  2. Activities: She is looking forward to visiting [place] again in June and July. She is not exercising. 3. Primary care provider: PA Armando Gang (Dr. Josiah Lobo, MD) 4. OB/GYN: Dr. Clint Lipps, [facility], phone [phone], fax [phone]  REVIEW OF SYSTEMS: She has no other issues involving her other body systems.    PHYSICAL EXAM: BP 123/79  Pulse 71  Wt 233 lb 8 oz (105.915 kg) Constitutional: The patient is alert and bright. She is clearly obese, but otherwise looks healthy. She appears to be emotionally well. Her weight has decreased from 241 to 233 in the last 4 months. Eyes: There is no arcus or proptosis. Ocular moisture appears normal. Mouth: The oropharynx appears normal. The tongue appears normal. There is normal oral moisture.  Neck: There are no bruits present. The thyroid gland appears normal in size. The thyroid gland is approximately 20+ grams in size. Her lobes are essentially normal in  size. Her isthmus is a bit enlarged. The consistency of the thyroid gland is normal. The thyroid gland is nontender today.    Lungs: The lungs are clear. Air movement is good. Heart: The heart rhythm and rate appear normal. Heart sounds S1 and S2 are  normal. I do not appreciate any pathologic heart murmurs. Abdomen: The abdomen  is enlarged. Bowel sounds are normal. The abdomen is soft. Her abdomen was not tender to palpation today.    Arms: Muscle mass appears appropriate for age and gender. Hands: There is no obvious tremor. Phalangeal and metacarpophalangeal joints appear normal. Palms are normal. Legs: Muscle mass appears appropriate for age and gender. There is no edema. 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  Neurologic: Muscle strength is normal for age and gender in both the upper and the lower extremities. Muscle tone appears normal. Sensation to touch is normal in the legs.  LAB DATA: HbA1c today is 5.3%.  12/28/12: TSH 4.18, free T4 1.5, free T3 2.4 07/22/12: HbA1c 5.6%, TSH 0.101, free T4 1.51, free T3 2.9 (on Synthroid 137 mcg/day)  ASSESSMENT:  1. Hypothyroid: She was hypothyroid last month. We have since increased her Synthroid dose to 150 mcg/day. Needs TFTs in 3 months. 2. Obesity: Her weight increased during the pregnancy. She has lost 2 pounds per month since last visit, equivalent to taking in 220 fewer calories/day. She can get rid of some of the excess fat by walking and eating right. Unfortunately, she has an aversion to exercise. 3. Goiter: Her thyroid gland is lightly larger today, possibly due to the increased TSH. 4. Hypertension: Her BP is much lower. Walking will help. Weight loss will also help. I do not want to start antihypertensive meds while she is breast-feeding.  5. Dyspepsia: This problem has improved since delivery.  6. Pre-diabetes: Her A1c is lower today and is well within normal limits.  7. Hashimoto's disease: Her thyroiditis is clinically quiescent today.   8. Hair loss: This is less of a problem now.   9. Secondary amenorrhea: She is not having menses while on depo-Provera. She is at risk for accelerated bone loss, especially if her calcium and vitamin  D intake are not robust enough.  PLAN:  1. Diagnostic: TFTs, estrogen, 25-hydroxy vitamin D, and calcium and PTH prior to next visit.  2. Therapeutic: Continue Synthroid dose as is, but adjust as needed.  Continue prenatal vitamins. 3. Patient Education: It is important to eat right and to exercise right. 4. Follow-up: three months.  Level of Service: This visit lasted in excess of 40 minutes. More than 50% of the visit was devoted to counseling.  David Stall

## 2013-03-04 ENCOUNTER — Other Ambulatory Visit: Payer: Self-pay | Admitting: *Deleted

## 2013-03-04 DIAGNOSIS — E038 Other specified hypothyroidism: Secondary | ICD-10-CM

## 2013-03-24 LAB — T4, FREE: Free T4: 1.3 ng/dL (ref 0.80–1.80)

## 2013-03-24 LAB — TSH: TSH: 6.644 u[IU]/mL — ABNORMAL HIGH (ref 0.350–4.500)

## 2013-03-28 ENCOUNTER — Ambulatory Visit (INDEPENDENT_AMBULATORY_CARE_PROVIDER_SITE_OTHER): Payer: BC Managed Care – PPO | Admitting: "Endocrinology

## 2013-03-28 ENCOUNTER — Encounter: Payer: Self-pay | Admitting: "Endocrinology

## 2013-03-28 VITALS — BP 128/75 | HR 87 | Wt 238.0 lb

## 2013-03-28 DIAGNOSIS — E559 Vitamin D deficiency, unspecified: Secondary | ICD-10-CM

## 2013-03-28 DIAGNOSIS — I10 Essential (primary) hypertension: Secondary | ICD-10-CM

## 2013-03-28 DIAGNOSIS — E038 Other specified hypothyroidism: Secondary | ICD-10-CM

## 2013-03-28 DIAGNOSIS — E049 Nontoxic goiter, unspecified: Secondary | ICD-10-CM

## 2013-03-28 DIAGNOSIS — E063 Autoimmune thyroiditis: Secondary | ICD-10-CM

## 2013-03-28 DIAGNOSIS — R5381 Other malaise: Secondary | ICD-10-CM

## 2013-03-28 DIAGNOSIS — R7309 Other abnormal glucose: Secondary | ICD-10-CM

## 2013-03-28 DIAGNOSIS — R7303 Prediabetes: Secondary | ICD-10-CM

## 2013-03-28 LAB — POCT GLYCOSYLATED HEMOGLOBIN (HGB A1C): Hemoglobin A1C: 5.2

## 2013-03-28 LAB — GLUCOSE, POCT (MANUAL RESULT ENTRY): POC Glucose: 97 mg/dl (ref 70–99)

## 2013-03-28 MED ORDER — LEVOTHYROXINE SODIUM 175 MCG PO TABS: ORAL_TABLET | ORAL | Status: DC

## 2013-03-28 NOTE — Patient Instructions (Signed)
Follow up visit in 3 months. Increase synthroid to 175 mcg/daty.

## 2013-03-28 NOTE — Progress Notes (Signed)
CC: FU of hypothyroid, Hashimoto's disease, goiter, obesity, hypertension, edema, dyspepsia, pre-diabetes, hyperlipidemia, oligomenorrhea, fatigue.  HISTORY OF PRESENT ILLNESS: 42 y.o. Caucasian woman  1. The patient was given a diagnosis of hyperlipidemia about 25 years ago. In the past few years that I've been her endocrinologist she has firmly but politely declined to take any lipid-lowering agents. She was slender until about 1998, then began gaining weight. She developed hypothyroidism secondary to Hashimoto's Thyroiditis in the 2007-2008 period. She started Synthroid in October 2008. As she has lost more thyrocytes over time we have gradually increased her Synthroid dose. She had  been taking metformin, 500 mg,twice daily for treatment of obesity and pre-diabetes, but that drug was discontinued during her pregnancy. She remains off the metformin while breast feeding. She has also been taking Prevacid for treatment of dyspepsia.  2. Her last PSSG visit was on 08/13/12. Her Synthroid dose had been increased to 150 mcg/day. Her CTS symptoms are better. She recently fractured her left 5th metatarsal, despite having had essentially no trauma.   3. Pertinent Review of Systems: Constitutional:  She feels "tired". She is losing more hair again. Energy level is low. She is still breast feeding. [name] has been much more restless in the night, so Sheralyn Boatman is not sleeping as well. Her allergies are acting up.  Eyes: Vision is good. There are no significant eye complaints. Neck: The patient has had no anterior neck symptoms recently.  Heart: She has not noted any "skipped beats" recently. The patient has no other complaints of palpitations, irregular heat beats, chest pain, or chest pressure. Gastrointestinal:  Bowel movents seem normally soft. She has no complaints of acid reflux, stomach aches or pains, or constipation. Arms: Arms sometimes get numb if she has pain in the posterior neck Legs: Muscle mass and  strength seem normal. Her left leg has not been  bothering her recently. No edema is noted. Feet: She is no longer having some pain at the site of her previous right foot fracture. The left foot is sore now.  There are no other. complaints of numbness, tingling, burning, or pain. No edema is noted. GYN: No typical menses since delivery, but she had constant bleeding for a month several months ago. She stopped depo-Provera about mid-July.     PAST MEDICAL, FAMILY, AND SOCIAL HISTORY: 1. Work and family: She is still working as a Editor, commissioning at [employer].  2. Activities: She had a great visit to [place] up until she broke her foot.  3. Primary care provider: PA Armando Gang (Dr. Josiah Lobo, MD) 4. OB/GYN: Dr. Clint Lipps, [facility], phone [phone], fax [phone]  REVIEW OF SYSTEMS: She has no other issues involving her other body systems.    PHYSICAL EXAM: BP 128/75  Pulse 87  Wt 238 lb (107.956 kg) Constitutional: The patient is alert, but seems very tired today. She is clearly obese, but otherwise looks healthy. She appears to be emotionally well. Her weight has increased from 233 to 238 in the last 3 months. Eyes: There is no arcus or proptosis. Ocular moisture appears normal. Mouth: The oropharynx appears normal. The tongue appears normal. There is normal oral moisture.  Neck: There are no bruits present. The thyroid gland appears normal in size. The thyroid gland is approximately 20+ grams in size. The left lobe is a bit enlarged. The isthmus is smaller, but still enlarged. The right lobe is normal in size. The consistency of the thyroid gland is normal. The  thyroid gland is nontender today.    Lungs: The lungs are clear. Air movement is good. Heart: The heart rhythm and rate appear normal. Heart sounds S1 and S2 are normal. I do not appreciate any pathologic heart murmurs. Abdomen: The abdomen  is enlarged. Bowel sounds are normal. The abdomen is soft. Her  abdomen was not tender to palpation today.    Arms: Muscle mass appears appropriate for age and gender. Hands: There is no obvious tremor. Phalangeal and metacarpophalangeal joints appear normal. Palms are normal. Legs: Muscle mass appears appropriate for age and gender. There is no edema. Her left foot and ankle are in a cast. 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   Neurologic: Muscle strength is normal for age and gender in both the upper and the lower extremities. Muscle tone appears normal. Sensation to touch is normal in the legs.  LAB DATA: HbA1c today is 5.2%, compared with 5.3% at last visit. 03/23/13: TSH 6.644, free T4 1.30, free T3 2.5; 25-hydroxy Vitamin D 27; calcium 9.4;  estradiol 145.8,  12/28/12: TSH 4.18, free T4 1.5, free T3 2.4 07/22/12: HbA1c 5.6%, TSH 0.101, free T4 1.51, free T3 2.9 (on Synthroid 137 mcg/day)  ASSESSMENT:  1. Hypothyroid: She is hypothyroid again this month. She continues to lose thyrocytes. We need to increase her Synthroid dose again. Needs repeat TFTs in 3 months. 2. Obesity: Her weight increased during the pregnancy. She had lost weight, but now weighs more. It's hard to know how much of her weight gain is due to her cast. 3. Goiter: Her thyroid gland is about the same size, but the individual lobes have changed size since last visit. The waxing and waning of thyroid gland size is c/w ongoing thyroiditis.  4. Hypertension: Her BP is suprisingly good.  5. Dyspepsia: This problem has improved since delivery.  6. Pre-diabetes: Her A1c is lower today and is well within normal limits.  7. Hashimoto's disease: Her thyroiditis is clinically quiescent today, but has been active recently.   8. Hair loss: This is more of a problem now.   9. Secondary amenorrhea: She is not having menses now. She is at risk for accelerated bone loss, especially if her calcium and vitamin D intake are not robust enough.  She needs to resume her pre-natal vitamins. 10. Fatigue: Some of her fatigue is clearly due to being hypothyroid, some due to her baby being very restless at night. She may also have anemia and/or iron deficiency.  PLAN:  1. Diagnostic: CBC and iron now. TFTs and 25-hydroxy vitamin D prior to next visit.  2. Therapeutic: Increase Synthroid to 175 mcg/day. Continue prenatal vitamins. 3. Patient Education: It is important to eat right and to exercise right when she can. 4. Follow-up: three months.  Level of Service: This visit lasted in excess of 45 minutes. More than 50% of the visit was devoted to counseling.  David Stall

## 2013-06-29 ENCOUNTER — Telehealth: Payer: Self-pay | Admitting: "Endocrinology

## 2013-06-29 NOTE — Telephone Encounter (Signed)
Made in error. Ana Murphy  °

## 2013-07-11 ENCOUNTER — Telehealth: Payer: Self-pay | Admitting: "Endocrinology

## 2013-07-11 NOTE — Telephone Encounter (Signed)
I received an e-mail message that when the patient saw her PA, Ms Elwyn Reach, there was the question of a metabolic problem. I called the patient to check upon this question, but she was not available. I left a voicemail message asking her to call me back.  David Stall

## 2013-07-13 ENCOUNTER — Other Ambulatory Visit: Payer: Self-pay | Admitting: *Deleted

## 2013-07-13 DIAGNOSIS — E038 Other specified hypothyroidism: Secondary | ICD-10-CM

## 2013-08-01 ENCOUNTER — Ambulatory Visit (INDEPENDENT_AMBULATORY_CARE_PROVIDER_SITE_OTHER): Payer: BC Managed Care – PPO | Admitting: "Endocrinology

## 2013-08-01 ENCOUNTER — Encounter: Payer: Self-pay | Admitting: "Endocrinology

## 2013-08-01 VITALS — BP 122/78 | HR 93 | Wt 245.0 lb

## 2013-08-01 DIAGNOSIS — N911 Secondary amenorrhea: Secondary | ICD-10-CM

## 2013-08-01 DIAGNOSIS — N912 Amenorrhea, unspecified: Secondary | ICD-10-CM

## 2013-08-01 DIAGNOSIS — E049 Nontoxic goiter, unspecified: Secondary | ICD-10-CM

## 2013-08-01 DIAGNOSIS — R5381 Other malaise: Secondary | ICD-10-CM

## 2013-08-01 DIAGNOSIS — L659 Nonscarring hair loss, unspecified: Secondary | ICD-10-CM

## 2013-08-01 DIAGNOSIS — R7309 Other abnormal glucose: Secondary | ICD-10-CM

## 2013-08-01 DIAGNOSIS — I10 Essential (primary) hypertension: Secondary | ICD-10-CM

## 2013-08-01 DIAGNOSIS — E038 Other specified hypothyroidism: Secondary | ICD-10-CM

## 2013-08-01 DIAGNOSIS — E063 Autoimmune thyroiditis: Secondary | ICD-10-CM

## 2013-08-01 DIAGNOSIS — R7303 Prediabetes: Secondary | ICD-10-CM

## 2013-08-01 LAB — POCT GLYCOSYLATED HEMOGLOBIN (HGB A1C): Hemoglobin A1C: 5.3

## 2013-08-01 LAB — GLUCOSE, POCT (MANUAL RESULT ENTRY): POC Glucose: 102 mg/dl — AB (ref 70–99)

## 2013-08-01 NOTE — Progress Notes (Signed)
CC: FU of hypothyroid, Hashimoto's disease, goiter, obesity, hypertension, edema, dyspepsia, pre-diabetes, hyperlipidemia, oligomenorrhea, fatigue.  HISTORY OF PRESENT ILLNESS: 42 y.o. Caucasian woman   1. Ana Murphy was given a diagnosis of hyperlipidemia about 25 years ago. In the past few years that I've been her endocrinologist she has firmly but politely declined to take any lipid-lowering agents. She was slender until about 1998, then began gaining weight. She developed hypothyroidism secondary to Hashimoto's Thyroiditis in the 2007-2008 period. She started Synthroid in October 2008. As she has lost more thyrocytes over time we have gradually increased her Synthroid dose. She had  been taking metformin, 500 mg,twice daily for treatment of obesity and pre-diabetes, but that drug was discontinued during her pregnancy. She remained off the metformin while breast feeding.   2. Her last [facility] visit was on 03/28/13. Her Synthroid dose had been increased to 175 mcg/day.   A. In the interim she has been very tired. [name] won't sleep in her own crib, so the baby now sleeps between [name] and her husband.  [name] had x-rays of her neck and a bone scan, which looked appropriate for her age. She has been taking physical therapy for her cervical spine and trapezius areas. She also uses her CTS gloves at night. Her CTS symptoms are better.   C. She has a recent URI, but is recovering.   D. Now that she is no longer breast feeding I recommended that she resume the metformin, but she politely but firmly refuses. She also stopped taking Prevacid for treatment of dyspepsia. She is afraid of the possible long-term adverse effects of prescription medications.   E. She admits to eating a lot of sweets, pasta, and bread.   3. Pertinent Review of Systems:  Constitutional:  She feels "tired", partly because she is not getting a lot of sleep. Energy level is "fine overall". She is walking more and taking stairs more. She is no  longer losing scalp hair. Eyes: Vision is good. There are no significant eye complaints. Neck: The patient has had no anterior neck symptoms recently.  Heart: She has not noted any "skipped beats" recently. The patient has no other complaints of palpitations, irregular heat beats, chest pain, or chest pressure. Gastrointestinal:  Bowel movents seem normally soft. She has no complaints of acid reflux, stomach aches or pains, or constipation. Arms: Arms sometimes get numb if she has pain in the posterior neck Legs: Muscle mass and strength seem normal. Her left leg has not been  bothering her recently. No edema is noted. Feet: She is no longer having pain at the site of her previous right foot fracture. There are no other. complaints of numbness, tingling, burning, or pain. No edema is noted. GYN: LMP was 07/14/13. She is not on birth control.     PAST MEDICAL, FAMILY, AND SOCIAL HISTORY: 1. Work and family: She is still working as a Editor, commissioning at [employer].  2. Activities: Not that her foot fracture has healed she is walking more.   3. Primary care provider: PA Armando Gang (Dr. Josiah Lobo, MD) 4. OB/GYN: Dr. Clint Lipps, [facility], phone [phone], fax [phone]  REVIEW OF SYSTEMS: She has no other issues involving her other body systems.    PHYSICAL EXAM: BP 122/78  Pulse 93  Wt 245 lb (111.131 kg) She has gained 7 pounds since last visit.  Constitutional: The patient is alert. She does not appear tired today. She is clearly obese, but otherwise looks  healthy. She appears to be emotionally well.  Eyes: There is no arcus or proptosis. Ocular moisture appears normal. Mouth: The oropharynx appears normal. The tongue appears normal. There is normal oral moisture.  Neck: There are no bruits present. The thyroid gland appears normal in size. The thyroid gland is again approximately 20+ grams in size. The left lobe is a bit enlarged. The isthmus and right lobe  are normal in size. The consistency of the thyroid gland is normal. The thyroid gland is nontender today.    Lungs: The lungs are clear. Air movement is good. Heart: The heart rhythm and rate appear normal. Heart sounds S1 and S2 are normal. I do not appreciate any pathologic heart murmurs. Abdomen: The abdomen  is enlarged. Bowel sounds are normal. The abdomen is soft. Her abdomen was not tender to palpation today.    Arms: Muscle mass appears appropriate for age and gender. Hands: There is no obvious tremor. Phalangeal and metacarpophalangeal joints appear normal. Palms are normal. Legs: Muscle mass appears appropriate for age and gender. There is no edema.  Neurologic: Muscle strength is normal for age and gender in both the upper and the lower extremities. Muscle tone appears normal. Sensation to touch is normal in the legs.  LAB DATA: HbA1c today is 5.3%, compared with 5.2% at last visit. 03/23/13: TSH 6.644, free T4 1.30, free T3 2.5; 25-hydroxy Vitamin D 27; calcium 9.4;  estradiol 145.8,  12/28/12: TSH 4.18, free T4 1.5, free T3 2.4 07/22/12: HbA1c 5.6%, TSH 0.101, free T4 1.51, free T3 2.9 (on Synthroid 137 mcg/day)  ASSESSMENT:  1. Hypothyroid: She was hypothyroid at last visit. She had lab tests drawn recently at Dr. Annett Gula office but we have not received these results. toni will call Dr. Annett Gula office tomorrow and ask them to fax the results of the labs and imaging studies to [place]. We'll see if we need to adjust her Synthroid dose then.  2. Obesity: Her weight increased since stopping breast feeding. She would like [place] to find the "hidden metabolic cause" of her obesity.  3. Goiter: Her thyroid gland is about the same size, but the individual lobes have changed size since last visit. The waxing and waning of thyroid gland size is c/w ongoing thyroiditis.  4. Hypertension: Her BP is good today.  5. Dyspepsia: This problem has improved since delivery.  6. Pre-diabetes: Her A1c is  lower today and is well within normal limits.  7. Hashimoto's disease: Her thyroiditis is clinically quiescent today.   8. Hair loss: This has resolved.  9. Secondary amenorrhea: She is now having menses again. She is at risk for accelerated bone loss, especially if her calcium and vitamin D intake are not robust enough. She is taking Centrum Silver and vitamin D.  10. Fatigue: Some of her fatigue was due to being hypothyroid, some due to her baby being very restless at night. She may also have anemia and/or iron deficiency.  PLAN:  1. Diagnostic: Review lab results from Dr. Cyndia Diver. 2. Therapeutic: Continue Synthroid at 175 mcg/day for now. Continue MVI and vitamin D. 3. Patient Education: It is important to eat right and to exercise right when she can. 4. Follow-up: three months.  Level of Service: This visit lasted in excess of 45 minutes. More than 50% of the visit was devoted to counseling.  David Stall

## 2013-08-01 NOTE — Patient Instructions (Signed)
Follow up visit in 3 months. Eat Right Diet and [facility] Diet.

## 2013-12-01 LAB — T4, FREE: Free T4: 1.63 ng/dL (ref 0.80–1.80)

## 2013-12-01 LAB — T3, FREE: T3, Free: 2.4 pg/mL (ref 2.3–4.2)

## 2013-12-01 LAB — VITAMIN D 25 HYDROXY (VIT D DEFICIENCY, FRACTURES): Vit D, 25-Hydroxy: 20 ng/mL — ABNORMAL LOW (ref 30–89)

## 2013-12-01 LAB — TSH: TSH: 4.446 u[IU]/mL (ref 0.350–4.500)

## 2013-12-08 ENCOUNTER — Telehealth: Payer: Self-pay | Admitting: *Deleted

## 2013-12-08 NOTE — Telephone Encounter (Signed)
She would like lab results, please call tonight. KW

## 2013-12-09 ENCOUNTER — Telehealth: Payer: Self-pay | Admitting: *Deleted

## 2013-12-09 ENCOUNTER — Other Ambulatory Visit: Payer: Self-pay | Admitting: *Deleted

## 2013-12-09 DIAGNOSIS — E038 Other specified hypothyroidism: Secondary | ICD-10-CM

## 2013-12-09 MED ORDER — LEVOTHYROXINE SODIUM 200 MCG PO TABS: 200.0000 ug | ORAL_TABLET | Freq: Every day | ORAL | Status: DC

## 2013-12-09 NOTE — Telephone Encounter (Signed)
Spoke to patients husband, advised that per Dr. Fransico MichaelBrennan TFTs are low. If she is taking Synthroid 175 mcg/day, we need to increase the dose to 200 mcg/day. Her vitamin d level is lower. She needs to take a good multivitamin every day. Synthroid already sent to pharmacy. He advises he will get the message to her. KW

## 2013-12-26 ENCOUNTER — Ambulatory Visit: Payer: BC Managed Care – PPO | Admitting: "Endocrinology

## 2013-12-26 ENCOUNTER — Telehealth: Payer: Self-pay | Admitting: *Deleted

## 2013-12-26 DIAGNOSIS — E038 Other specified hypothyroidism: Secondary | ICD-10-CM

## 2013-12-26 MED ORDER — LEVOTHYROXINE SODIUM 200 MCG PO TABS: 200.0000 ug | ORAL_TABLET | Freq: Every day | ORAL | Status: DC

## 2013-12-26 NOTE — Telephone Encounter (Signed)
Script sent  

## 2014-03-09 ENCOUNTER — Other Ambulatory Visit: Payer: Self-pay | Admitting: *Deleted

## 2014-04-05 ENCOUNTER — Other Ambulatory Visit: Payer: Self-pay | Admitting: *Deleted

## 2014-04-05 DIAGNOSIS — IMO0002 Reserved for concepts with insufficient information to code with codable children: Secondary | ICD-10-CM

## 2014-04-05 DIAGNOSIS — E1065 Type 1 diabetes mellitus with hyperglycemia: Secondary | ICD-10-CM

## 2014-05-23 LAB — VITAMIN D 25 HYDROXY (VIT D DEFICIENCY, FRACTURES): Vit D, 25-Hydroxy: 26 ng/mL — ABNORMAL LOW (ref 30–89)

## 2014-05-23 LAB — TSH: TSH: 1.922 u[IU]/mL (ref 0.350–4.500)

## 2014-05-23 LAB — T3, FREE: T3 FREE: 2.9 pg/mL (ref 2.3–4.2)

## 2014-05-23 LAB — T4, FREE: FREE T4: 1.4 ng/dL (ref 0.80–1.80)

## 2014-05-29 ENCOUNTER — Encounter: Payer: Self-pay | Admitting: "Endocrinology

## 2014-05-29 ENCOUNTER — Ambulatory Visit (INDEPENDENT_AMBULATORY_CARE_PROVIDER_SITE_OTHER): Payer: BC Managed Care – PPO | Admitting: "Endocrinology

## 2014-05-29 VITALS — BP 130/83 | HR 99 | Wt 262.0 lb

## 2014-05-29 DIAGNOSIS — I10 Essential (primary) hypertension: Secondary | ICD-10-CM

## 2014-05-29 DIAGNOSIS — R7309 Other abnormal glucose: Secondary | ICD-10-CM

## 2014-05-29 DIAGNOSIS — M5430 Sciatica, unspecified side: Secondary | ICD-10-CM | POA: Insufficient documentation

## 2014-05-29 DIAGNOSIS — M5432 Sciatica, left side: Secondary | ICD-10-CM

## 2014-05-29 DIAGNOSIS — G4733 Obstructive sleep apnea (adult) (pediatric): Secondary | ICD-10-CM

## 2014-05-29 DIAGNOSIS — E559 Vitamin D deficiency, unspecified: Secondary | ICD-10-CM

## 2014-05-29 DIAGNOSIS — E063 Autoimmune thyroiditis: Secondary | ICD-10-CM

## 2014-05-29 DIAGNOSIS — E669 Obesity, unspecified: Secondary | ICD-10-CM

## 2014-05-29 DIAGNOSIS — G4730 Sleep apnea, unspecified: Secondary | ICD-10-CM

## 2014-05-29 DIAGNOSIS — E038 Other specified hypothyroidism: Secondary | ICD-10-CM

## 2014-05-29 DIAGNOSIS — R0683 Snoring: Secondary | ICD-10-CM

## 2014-05-29 DIAGNOSIS — R5383 Other fatigue: Secondary | ICD-10-CM

## 2014-05-29 DIAGNOSIS — R1013 Epigastric pain: Secondary | ICD-10-CM

## 2014-05-29 DIAGNOSIS — R7303 Prediabetes: Secondary | ICD-10-CM

## 2014-05-29 LAB — GLUCOSE, POCT (MANUAL RESULT ENTRY): POC GLUCOSE: 125 mg/dL — AB (ref 70–99)

## 2014-05-29 LAB — POCT GLYCOSYLATED HEMOGLOBIN (HGB A1C): HEMOGLOBIN A1C: 5.6

## 2014-05-29 MED ORDER — METFORMIN HCL 500 MG PO TABS: 500.0000 mg | ORAL_TABLET | Freq: Two times a day (BID) | ORAL | Status: DC

## 2014-05-29 NOTE — Patient Instructions (Signed)
Follow up visit in 3 months. Call Dr. Fransico Guage Efferson with results of insurance company inquiry about obstructive sleep apnea study.

## 2014-05-29 NOTE — Progress Notes (Signed)
CC: FU of hypothyroid, Hashimoto's disease, goiter, obesity, hypertension, edema, dyspepsia, pre-diabetes, hyperlipidemia, oligomenorrhea, fatigue.  HISTORY OF PRESENT ILLNESS: 43 y.o. Caucasian woman   1. Ana Murphy had her first endocrine consultation with me on or prior to 07/26/10. She had been given a diagnosis of hyperlipidemia about 25 years ago. In the past few years that I've been her endocrinologist she has firmly but politely declined to take any lipid-lowering agents. She was slender until about 1998, then began gaining weight. She developed hypothyroidism secondary to Hashimoto's Thyroiditis in the 2007-2008 period. She started Synthroid in October 2008. As she has lost more thyrocytes over time we have gradually increased her Synthroid dose. She had  been taking metformin, 500 mg,twice daily for treatment of obesity and pre-diabetes, but that drug was discontinued during her pregnancy in 2013. She remained off the metformin while breast feeding.   2. Her last [facility] visit was on 08/01/13. Her Synthroid dose has since been increased to 200 mcg/day.   A. In the interim she has still been tired. She is snoring more and has more apnea. Her husband has told her that he is very worried about her breathing.   B. She is gaining weight and feeling more bloated and gassy all the time. She also feels as if she is retaining fluid in her legs. She is not walking much due to her left sciatica.  C. In May she had a steroid shot in the left wrist to treat her carpal tunnel syndrome (CTS). Her physician put her on generic Cymbalta in May, which she took for awhile. She lost 6 kg, but stopped the drug due to concerns about side effects, to include loss of libido. She then regained the weight when she returned from [place].   D. I recommended last year that she resume the metformin, but she politely but firmly refused. She also stopped taking Prevacid for treatment of dyspepsia. She is afraid of the possible long-term  adverse effects of prescription medications.   E. She admits to craving and eating a lot of sweets, pasta, and bread.   F. She has more pains and aching in her muscles and joints everywhere.   3. Pertinent Review of Systems:  Constitutional:  She feels "tired", partly because she is not getting a lot of sleep. Energy level is "low". "I drag myself every day." She is still losing scalp hair, but not as much. Eyes: Vision is good with her glasses. There are no significant eye complaints. Neck: The patient has had no anterior neck symptoms recently.  Heart: She has occasional fast heart rate when she gets mad at her younger son or when she uses nasal decongestants. The patient has no other complaints of palpitations, irregular heat beats, chest pain, or chest pressure. Gastrointestinal:  As above. Bowel movents are less frequent. She has no complaints of acid reflux, stomach aches or pains, or diarrhea. Arms: Arms sometimes get numb if she has pain in the posterior neck Legs: Muscle mass and strength seem normal. Her left leg has been  bothering her more frequently. No edema is noted. Feet: She is no longer having pain at the site of her previous right foot fracture. There are no other. complaints of numbness, tingling, burning, or pain. No edema is noted. GYN: LMP was 07/14/13. She is on Depo-provera.     PAST MEDICAL, FAMILY, AND SOCIAL HISTORY: 1. Work and family: She is still working as a Editor, commissioning at [employer].  2. Activities: Now  that her foot fracture has healed she is walking more.   3. Primary care provider: PA Armando GangSherry Booth (Dr. Josiah LoboJohn McFadden, MD) 4. OB/GYN: Dr. Clint LippsKalpen Patel, [facility], phone [phone], fax [phone]  REVIEW OF SYSTEMS: She has no other issues involving her other body systems.    PHYSICAL EXAM: BP 130/83  Pulse 99  Wt 262 lb (118.842 kg) She has gained 17 pounds since last visit.  Constitutional: The patient is alert. She appears  tired today. She is more obese. She is also more tearful and somewhat depressed today.  Eyes: There is no arcus or proptosis. Ocular moisture appears normal. Mouth: The oropharynx appears normal. The tongue appears normal. There is normal oral moisture.  Neck: There are no bruits present. The thyroid gland appears normal in size. The thyroid gland is smaller at 20 grams in size. The lobes are not enlarged today. The consistency of the thyroid gland is normal. The thyroid gland is nontender today.    Lungs: The lungs are clear. Air movement is good. Heart: The heart rhythm and rate appear normal. Heart sounds S1 and S2 are normal. I do not appreciate any pathologic heart murmurs. Abdomen: The abdomen  is more enlarged. Bowel sounds are normal. The abdomen is soft. Her abdomen was not tender to palpation today.    Arms: Muscle mass appears appropriate for age and gender. Hands: There is no obvious tremor. Phalangeal and metacarpophalangeal joints appear normal. Palms are normal. Legs: Muscle mass appears appropriate for age and gender. There is trace edema of her shins today.  Neurologic: Muscle strength is normal for age and gender in both the upper and the lower extremities. Muscle tone appears normal. Sensation to touch is normal in the legs.  LAB DATA: HbA1c today is 5.6%, compared with 5.3% at last visit and 5.2% at the prior visit.   Labs 05/22/14: TSH 1.922, free T4 1.40, free T3 2.9; 25-hydroxy vitamin D 26  Labs 11/30/13: TSH 4.445, free T4 1.30, free T3 2.4  Labs 03/23/13: TSH 6.644, free T4 1.30, free T3 2.5; 25-hydroxy Vitamin D 27; calcium 9.4;  estradiol 145.8,   Labs 12/28/12: TSH 4.18, free T4 1.5, free T3 2.4  Labs 07/22/12: HbA1c 5.6%, TSH 0.101, free T4 1.51, free T3 2.9 (on Synthroid 137 mcg/day)  ASSESSMENT:  1. Hypothyroid: She was hypothyroid in December 2014 and again in April 2015, but is euthyroid now on her Synthroid dose of 200 mcg/day. Unfortunately, the fact that  she is euthyroid means that her fatigue is not due to hypothyroidism.  2. Obesity: Her weight increased since starting Depo-provera and stopping exercise. She has also done a lot of comfort eating in the past several months. She would like us to find the "hidden metabolic cause" of her obesity.  3. Goiter: Her thyroid gland is smaller. The waxing and waning of thyroid gland size is c/w ongoing thyroiditis. The need to progressively increase her Synthroid dose over time is also evidence of progressive destruction of thyrocytes by the Hashimoto's disease T lymphocytes.  4. Hypertension: Her SBP is good today, but her DBP is high. I offered her the choice of medication or exercise and she adamantly chose the later.   5. Dyspepsia: This problem has worsened again.   6. Pre-diabetes: Her A1c is higher today, but is still within normal limits.  7. Hashimoto's disease: Her thyroiditis is clinically quiescent today.   8. Hair loss: This has essentially resolved.  9. Secondary amenorrhea: She is not having  menses on Depo-provera.  She is at risk for accelerated bone loss, especially if her calcium and vitamin D intake are not robust enough. She is taking Centrum Silver and vitamin D.  10. Fatigue/snoring,apnea: Most of her fatigue/snoring/apnea is due to obstructive sleep apnea. She needs an OSA study. I've asked her to contact her insurance company to see if there are any restrictions as to who can order the study and where it must be done. Some of her fatigue is also due to deconditioning. She could also have anemia and/or iron deficiency. 11. Acanthosis, nigricans: This is worse, indicating more resistance to insulin and hyperinsulinemia as she's gained weight.  12. Vitamin D deficiency disease: She needs to be consistent with taking vitamin D.   PLAN:  1. Diagnostic: CBC, iron, B1, B6, and B12., lipid panel, and CMP.  2. Therapeutic: Continue Synthroid at 200 mcg/day for now. Continue MVI and vitamin D.  Start metformin, 500 mg, twice daily.  3. Patient Education: It is important to eat right and to exercise right when she can. We need to address the different causes of her metabolic syndrome.  4. Follow-up: three months.  Level of Service: This visit lasted in excess of 55 minutes. More than 50% of the visit was devoted to counseling.  David StallBRENNAN,MICHAEL J

## 2014-06-01 LAB — LIPID PANEL
Cholesterol: 189 mg/dL (ref 0–200)
HDL: 53 mg/dL (ref >39)
LDL Cholesterol: 107 mg/dL — ABNORMAL HIGH (ref 0–99)
Total CHOL/HDL Ratio: 3.6 Ratio
Triglycerides: 144 mg/dL (ref <150)
VLDL: 29 mg/dL (ref 0–40)

## 2014-06-01 LAB — CBC
HEMATOCRIT: 47.3 % — AB (ref 36.0–46.0)
HEMOGLOBIN: 15.8 g/dL — AB (ref 12.0–15.0)
MCH: 29 pg (ref 26.0–34.0)
MCHC: 33.4 g/dL (ref 30.0–36.0)
MCV: 86.8 fL (ref 78.0–100.0)
Platelets: 228 10*3/uL (ref 150–400)
RBC: 5.45 MIL/uL — AB (ref 3.87–5.11)
RDW: 13.2 % (ref 11.5–15.5)
WBC: 6.2 10*3/uL (ref 4.0–10.5)

## 2014-06-01 LAB — COMPREHENSIVE METABOLIC PANEL
ALK PHOS: 53 U/L (ref 39–117)
ALT: 19 U/L (ref 0–35)
AST: 17 U/L (ref 0–37)
Albumin: 4.4 g/dL (ref 3.5–5.2)
BUN: 16 mg/dL (ref 6–23)
CO2: 27 mEq/L (ref 19–32)
CREATININE: 0.92 mg/dL (ref 0.50–1.10)
Calcium: 9.7 mg/dL (ref 8.4–10.5)
Chloride: 104 mEq/L (ref 96–112)
Glucose, Bld: 97 mg/dL (ref 70–99)
POTASSIUM: 4.9 meq/L (ref 3.5–5.3)
Sodium: 138 mEq/L (ref 135–145)
Total Bilirubin: 0.5 mg/dL (ref 0.2–1.2)
Total Protein: 7.7 g/dL (ref 6.0–8.3)

## 2014-06-01 LAB — IRON: IRON: 118 ug/dL (ref 42–145)

## 2014-06-01 LAB — VITAMIN B12: Vitamin B-12: 746 pg/mL (ref 211–911)

## 2014-06-05 LAB — VITAMIN B6: VITAMIN B6: 44.6 ng/mL — AB (ref 2.1–21.7)

## 2014-06-26 ENCOUNTER — Telehealth: Payer: Self-pay | Admitting: "Endocrinology

## 2014-06-26 NOTE — Telephone Encounter (Signed)
Sent Friday via epic. kw

## 2014-07-05 ENCOUNTER — Encounter: Payer: Self-pay | Admitting: *Deleted

## 2014-10-07 ENCOUNTER — Telehealth: Payer: Self-pay | Admitting: "Endocrinology

## 2014-10-07 NOTE — Telephone Encounter (Signed)
1. The patient called earlier in the week asking me to call her, but the subject of her call was somewhat difficult to understand.  2. I returned her call this evening. She was unavailable. I left a voicemail message asking hr to return my call.   David StallBRENNAN,MICHAEL J

## 2014-10-09 ENCOUNTER — Telehealth: Payer: Self-pay | Admitting: "Endocrinology

## 2014-10-09 DIAGNOSIS — G4710 Hypersomnia, unspecified: Secondary | ICD-10-CM

## 2014-10-09 DIAGNOSIS — G4730 Sleep apnea, unspecified: Principal | ICD-10-CM

## 2014-10-09 NOTE — Telephone Encounter (Signed)
1. I called Ana Murphy again today. She said that her physician did blood work to check out her arthritis. Her vitamin D was also low. Later she also had abnormal blood tests for celiac disease 2. She is taking Centrum for Women with 1000 IU per day and Vitamin D3, 1000, IU/day. She also requests a sleep apnea study. 3. I asked her to have her PCP send me her most recent thyroid test results, vitamin D results, CMP, and celiac disease results. 4. [facility]. David StallBRENNAN,MICHAEL J

## 2014-10-12 ENCOUNTER — Telehealth: Payer: Self-pay | Admitting: "Endocrinology

## 2014-10-12 NOTE — Telephone Encounter (Signed)
1. I called Ana Murphy to discuss her lab results [facility] at [facility] on 10[facility]14.   A. Her celiac panel showed a TTG IgA value of 32 (normal <3, positive > 10). Her IgA level was normal at 157 (normal 91-414). Her TTG IgG was high at 72 (normal 0.5).  B. Her CMP showed an albumin level of 4.6, which was in the upper half of the normal range (3.5-5.5).  C. Her 25-OH vitamin D level was low at 25.1 (normal 30-100).  D. Her rheumatoid factor was normal at <10. 2. Although the TTG IgA and TTG IgG are elevated, she does not have any abdominal pains or diarrhea, she is still gaining weight, and her serum albumin is quite normal. If she had clinically significant celiac disease causing significant malabsorption I would expect her to have more abdominal symptoms, I would expect her to be losing weight rather than gaining weight, and I would expect her to have a low-normal or frankly low serum albumin level. Since our Eat Right Diet to lose weight is essentially a gluten-free diet, I certainly endorse her following that diet. I agree that it is probably worthwhile to have a GI consultation, but I would probably not want to have an endoscopy unless her symptoms and serum albumin deteriorated.  3. Her 25-OH vitamin D level was still low after taking 1000 IU of Vitamin D twice a day. I asked her to increase the vitamin D to 3,000 IU per day.  4. Since [facility] did not send me her TFT results she will call [facility] tomorrow morning and ask them to do so.  David StallBRENNAN,MICHAEL J

## 2014-10-26 ENCOUNTER — Other Ambulatory Visit: Payer: Self-pay | Admitting: *Deleted

## 2014-10-26 DIAGNOSIS — G4700 Insomnia, unspecified: Secondary | ICD-10-CM

## 2014-11-30 ENCOUNTER — Ambulatory Visit (HOSPITAL_BASED_OUTPATIENT_CLINIC_OR_DEPARTMENT_OTHER): Payer: BLUE CROSS/BLUE SHIELD | Attending: "Endocrinology

## 2014-11-30 VITALS — Ht 68.0 in | Wt 262.0 lb

## 2014-11-30 DIAGNOSIS — R0683 Snoring: Secondary | ICD-10-CM | POA: Insufficient documentation

## 2014-11-30 DIAGNOSIS — G4733 Obstructive sleep apnea (adult) (pediatric): Secondary | ICD-10-CM | POA: Insufficient documentation

## 2014-11-30 DIAGNOSIS — G4700 Insomnia, unspecified: Secondary | ICD-10-CM

## 2014-11-30 DIAGNOSIS — G4709 Other insomnia: Secondary | ICD-10-CM | POA: Diagnosis present

## 2014-12-01 DIAGNOSIS — G4700 Insomnia, unspecified: Secondary | ICD-10-CM | POA: Diagnosis not present

## 2014-12-02 NOTE — Sleep Study (Signed)
   NAME: Ana Murphy DATE OF BIRTH:  [birth year] MEDICAL RECORD NUMBER [id number]  LOCATION: [facility]  PHYSICIAN: YOUNG,CLINTON D  DATE OF STUDY: 11/30/2014  SLEEP STUDY TYPE: Nocturnal Polysomnogram               REFERRING PHYSICIAN: David StallBrennan, Michael J, MD  INDICATION FOR STUDY: Insomnia with sleep apnea  EPWORTH SLEEPINESS SCORE:   18/24 HEIGHT: 5\' 8"  (172.7 cm)  WEIGHT: 262 lb (118.842 kg)    Body mass index is 39.85 kg/(m^2).  NECK SIZE: 16 in.  MEDICATIONS: Charted for review  SLEEP ARCHITECTURE: Total sleep time 292.5 minutes with sleep efficiency 81.5%. Stage I was 10.3%, stage II 73.3%, stage III absent, REM 16.4% of total sleep time. Sleep latency 8.5 minutes, REM latency 103 minutes, awake after sleep onset 40 minutes, arousal index 27.7, bedtime medication: None  RESPIRATORY DATA: Apnea hypopnea index (AHI) 100.5 per hour. 490 total events scored including 137 obstructive apneas, 1 central apnea, 352 hypopneas. Non-positional events. REM AHI 140 per hour. This study was ordered as a diagnostic polysomnogram protocol without CPAP titration.  OXYGEN DATA: Very loud snoring with oxygen desaturation to a nadir of 67% and mean saturation 86.7% on room air. Room air oxygen saturation on arrival while upright and awake was 93%.  CARDIAC DATA: Normal sinus rhythm  MOVEMENT/PARASOMNIA: No significant movement disturbance, bathroom 1  IMPRESSION/ RECOMMENDATION:   1) Very severe obstructive sleep apnea/hypopnea syndrome, AHI 100.5 per hour with non-positional events. Very loud snoring with oxygen desaturation to a nadir of 67% and mean saturation 86.7% on room air through the study. Oxygen saturation on arrival while awake was 93% on room air. 2) This study was ordered as a diagnostic polysomnogram without CPAP. CPAP titration would usually be the preferred treatment for scores in this range unless individual circumstances suggest otherwise. This patient  can return for dedicated CPAP titration study if appropriate.   Waymon BudgeYOUNG,CLINTON D Diplomate, American Board of Sleep Medicine  ELECTRONICALLY SIGNED ON:  12/02/2014, 3:33 PM  [facility] PH: [phone]   FX: [phone] ACCREDITED BY THE AMERICAN ACADEMY OF SLEEP MEDICINE

## 2014-12-06 ENCOUNTER — Telehealth: Payer: Self-pay | Admitting: *Deleted

## 2014-12-06 ENCOUNTER — Other Ambulatory Visit: Payer: Self-pay | Admitting: *Deleted

## 2014-12-06 DIAGNOSIS — G4733 Obstructive sleep apnea (adult) (pediatric): Secondary | ICD-10-CM

## 2014-12-06 NOTE — Telephone Encounter (Signed)
Spoke to patient, advised that per her sleep study she needs a CPAP titration, I scheduled it for Monday May 2nd at 8pm at the Sleep Center.

## 2014-12-11 ENCOUNTER — Ambulatory Visit (HOSPITAL_BASED_OUTPATIENT_CLINIC_OR_DEPARTMENT_OTHER): Payer: BLUE CROSS/BLUE SHIELD | Attending: "Endocrinology

## 2014-12-11 VITALS — Ht 68.0 in

## 2014-12-11 DIAGNOSIS — G4730 Sleep apnea, unspecified: Secondary | ICD-10-CM | POA: Diagnosis not present

## 2014-12-11 DIAGNOSIS — R0683 Snoring: Secondary | ICD-10-CM | POA: Insufficient documentation

## 2014-12-11 DIAGNOSIS — G4710 Hypersomnia, unspecified: Secondary | ICD-10-CM | POA: Diagnosis not present

## 2014-12-11 DIAGNOSIS — G4733 Obstructive sleep apnea (adult) (pediatric): Secondary | ICD-10-CM

## 2014-12-18 ENCOUNTER — Telehealth: Payer: Self-pay | Admitting: Internal Medicine

## 2014-12-18 DIAGNOSIS — G4733 Obstructive sleep apnea (adult) (pediatric): Secondary | ICD-10-CM | POA: Diagnosis not present

## 2014-12-18 NOTE — Telephone Encounter (Signed)
Sleep center requesting that CY come by and read titration study.  [place] going [place] and would like to have her results prior to leaving so she can get her equipment. FYI  To CY

## 2014-12-18 NOTE — Telephone Encounter (Signed)
done

## 2014-12-18 NOTE — Sleep Study (Signed)
   NAME: Ana Murphy DATE OF BIRTH:  Mar 06, 1971 MEDICAL RECORD NUMBER [id number]  LOCATION: [facility]  PHYSICIAN: YOUNG,CLINTON D  DATE OF STUDY: 12/11/2014  SLEEP STUDY TYPE: Nocturnal Polysomnogram               REFERRING PHYSICIAN: David StallBrennan, Michael J, MD  INDICATION FOR STUDY: Hypersomnia with sleep apnea-CPAP titration  EPWORTH SLEEPINESS SCORE:   18/24 HEIGHT: 5\' 8"  (172.7 cm)  WEIGHT:      There is no weight on file to calculate BMI.  NECK SIZE: 16 in.  MEDICATIONS: Charted for review  SLEEP ARCHITECTURE: Total sleep time 360.5 minutes with sleep efficiency 95.6%. Stage I was 2.1%, stage II 65.2%, stage III 1.1%, REM 31.6% of total sleep time. Sleep latency 7.5 minutes, REM latency 60 minutes, awake after sleep onset 9.5 minutes, arousal index 6.0, bedtime medication: None  RESPIRATORY DATA: CPAP titration protocol. CPAP was titrated to 15 CWP, AHI 0 per hour. She wore a medium fullface mask  OXYGEN DATA: Snoring was prevented at final CPAP with mean oxygen saturation 94.2% on room air  CARDIAC DATA: Normal sinus rhythm  MOVEMENT/PARASOMNIA: No significant movement disturbance. No bathroom trips  IMPRESSION/ RECOMMENDATION:   1) Successful CPAP titration to 15 CWP, AHI 0 per hour. She wore a medium F&P Simplus fullface mask with heated humidifier. Snoring was prevented and mean oxygen saturation was 94.2% on room air. 2) Baseline polysomnogram on 11/30/2014 recorded AHI 100.5 per hour. Body weight for that study was 262 pounds.`   Waymon BudgeYOUNG,CLINTON D Diplomate, American Board of Sleep Medicine  ELECTRONICALLY SIGNED ON:  12/18/2014, 12:03 PM [facility] PH: [phone]   FX: [phone] ACCREDITED BY THE AMERICAN ACADEMY OF SLEEP MEDICINE

## 2014-12-19 NOTE — Telephone Encounter (Signed)
That study has been read. I have never seen her. The sleep studies were ordered by Dr Fransico MichaelBrennan, and she needs to contact his office for treatment plans.

## 2014-12-19 NOTE — Telephone Encounter (Signed)
CY please advise on results or when they may be available. Thanks.

## 2014-12-19 NOTE — Telephone Encounter (Signed)
Spoke with Ana Murphy at sleep center, states everything has been taken care of with this.  Nothing further needed.

## 2014-12-22 ENCOUNTER — Other Ambulatory Visit: Payer: Self-pay | Admitting: Internal Medicine

## 2014-12-22 ENCOUNTER — Telehealth: Payer: Self-pay | Admitting: Internal Medicine

## 2014-12-22 ENCOUNTER — Telehealth: Payer: Self-pay | Admitting: "Endocrinology

## 2014-12-22 DIAGNOSIS — G4733 Obstructive sleep apnea (adult) (pediatric): Secondary | ICD-10-CM

## 2014-12-22 NOTE — Telephone Encounter (Signed)
Error

## 2014-12-22 NOTE — Telephone Encounter (Signed)
1.I called Dr. Maple HudsonYoung, who graciously agreed to order the C-pap machine for Ana Murphy, but stated that he does need to see her in follow up in order to adjust the C-pap settings. I told him that she would agree. He said that the C-pap machine will be available early enough so that she can wear it for several days prior to her trip to [place]. 2. I called Ana Murphy with this info. She does want to see Dr. Maple HudsonYoung. I told her that his office will call her. If she has not heard anything by noon on Monday, I asked her to call me directly so that I can re-energize the process. She agreed and was most appreciative.   David StallBRENNAN,Jenya Putz J

## 2014-12-22 NOTE — Assessment & Plan Note (Signed)
Patient with severe OSA. Sleep studies were ordered by Dr Molli KnockMichael Brennan, Endocrinology. He asks that we help patient get started with CPAP and follow her. She wants CPAP started before she goes to [place] soon, for 2 months. We are ordering CPAP and will schedule office visit after she returns.

## 2014-12-22 NOTE — Telephone Encounter (Signed)
1. Ana Murphy called. She has not heard anything about obtaining her C-pap machine. 2. When I referred her to for an OSA evaluation I assumed that she would see Dr. Maple HudsonYoung, then have a sleep study, then have the OSA studies, and then Dr. Maple HudsonYoung would order her C-pap machine.  3. In fact, she could not see Dr. Maple HudsonYoung because his schedule was full. She did have both studies and Dr. Maple HudsonYoung read both studies, but I never received the information that it would be up to me to order the C-pap machine, which I have never done. 4. I told her that I will call Dr. Maple HudsonYoung today to find out how to order the C-pap machine.  5. I called Dr. Maple HudsonYoung, but he was not available. I left a message with the secretary asking him to call me on my cell phone about the patient's problem.  David StallBRENNAN,MICHAEL J

## 2014-12-26 ENCOUNTER — Encounter: Payer: Self-pay | Admitting: "Endocrinology

## 2014-12-26 ENCOUNTER — Ambulatory Visit (INDEPENDENT_AMBULATORY_CARE_PROVIDER_SITE_OTHER): Payer: BLUE CROSS/BLUE SHIELD | Admitting: "Endocrinology

## 2014-12-26 VITALS — BP 125/78 | HR 99 | Wt 263.0 lb

## 2014-12-26 DIAGNOSIS — E038 Other specified hypothyroidism: Secondary | ICD-10-CM | POA: Diagnosis not present

## 2014-12-26 DIAGNOSIS — R5383 Other fatigue: Secondary | ICD-10-CM

## 2014-12-26 DIAGNOSIS — R7309 Other abnormal glucose: Secondary | ICD-10-CM | POA: Diagnosis not present

## 2014-12-26 DIAGNOSIS — I10 Essential (primary) hypertension: Secondary | ICD-10-CM

## 2014-12-26 DIAGNOSIS — E063 Autoimmune thyroiditis: Secondary | ICD-10-CM | POA: Diagnosis not present

## 2014-12-26 DIAGNOSIS — L83 Acanthosis nigricans: Secondary | ICD-10-CM

## 2014-12-26 DIAGNOSIS — G4733 Obstructive sleep apnea (adult) (pediatric): Secondary | ICD-10-CM | POA: Diagnosis not present

## 2014-12-26 DIAGNOSIS — E559 Vitamin D deficiency, unspecified: Secondary | ICD-10-CM

## 2014-12-26 DIAGNOSIS — R7303 Prediabetes: Secondary | ICD-10-CM

## 2014-12-26 LAB — GLUCOSE, POCT (MANUAL RESULT ENTRY): POC Glucose: 96 mg/dl (ref 70–99)

## 2014-12-26 LAB — POCT GLYCOSYLATED HEMOGLOBIN (HGB A1C): Hemoglobin A1C: 5.6

## 2014-12-26 NOTE — Patient Instructions (Signed)
Follow up visit in 3 months. Please repeat lab tests one week prior to next visit. 

## 2014-12-26 NOTE — Progress Notes (Signed)
CC: FU of hypothyroid, Hashimoto's disease, goiter, obesity, hypertension, edema, dyspepsia, pre-diabetes, hyperlipidemia, oligomenorrhea, fatigue.  HISTORY OF PRESENT ILLNESS: 44 y.o. Caucasian woman   1. Ana Murphy had her first endocrine consultation with me on or prior to 07/26/10. She had been given a diagnosis of hyperlipidemia about 25 years ago. In the past few years that I've been her endocrinologist she has firmly but politely declined to take any lipid-lowering agents. She was slender until about 1998, then began gaining weight. She developed hypothyroidism secondary to Hashimoto's Thyroiditis in the 2007-2008 period. She started Synthroid in October 2008. As she has lost more thyrocytes over time we have gradually increased her Synthroid dose. She had  been taking metformin, 500 mg, twice daily for treatment of obesity and pre-diabetes, except during the time that she was pregnant and was breast-feeding in 2013-2014. As she has gained more weight she has developed obstructive sleep apnea (OSA).   2. Her last [facility] visit was on 05/29/14. Her Synthroid dose was continued at 200 mcg/day and her metformin dose of 500 mg, twice daily. .   A. In the interim she has been more tired. She has "no strength to do anything". "I drag myself through the day."  She failed her sleep apnea test. She has severe obstructive sleep apnea (OSA). She has been tested for a C-pap machine and will pick it up tomorrow.   B. She is gaining weight and feeling more bloated and gassy all the time. She also feels as if she is retaining fluid in her legs. She is not walking much due to her left sciatica.  C. 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   D. She continues to have a variety of aching pains in her legs, groin, and other areas of her body. The pains can involve both joints and muscles. When she has pains or feels tired she  won't exercise.   E. She did not take her Depo-Provera in February, so her periods and hot flashes recurred.   F. She admits to craving and eating a lot of sweets, pasta, and bread, but especially ice cream.    3. Pertinent Review of Systems:  Constitutional:  She feels "tired", partly because she is not getting a lot of sleep. Energy level is "low". She is still losing scalp hair, but not as much. Eyes: Vision is good with her glasses. Her eye pressure is high. She will be re-checked in 6 months There are no significant eye complaints. Neck: The patient feels that her anterior neck is larger. She has not had any other anterior neck symptoms recently.  Heart: She has occasional fast heart rate when she gets mad at her younger son or when she uses nasal decongestants. The patient has no other complaints of palpitations, irregular heat beats, chest pain, or chest pressure. Gastrointestinal:  As above. Bowel movents are less frequent. She has no complaints of acid reflux, stomach aches or pains, or diarrhea. Arms: Arms sometimes get numb if she has pain in the posterior neck Legs: Lots of legs and pains.Muscle mass and strength seem normal. Her left leg has been  bothering her more frequently. No edema is noted. Feet: She is no longer having much pain at the site of her previous right foot fracture. There are no other. complaints of numbness, tingling, burning, or pain. No edema is noted. GYN: LMP was  May 10th. She will resume Depo-provera injections soon.  PAST MEDICAL, FAMILY, AND SOCIAL HISTORY: 1. Work and family: She is changing to an Designer, industrial/productadministrative job at [employer]  2. Activities: Very little   3. Primary care provider: Dr. Montey HoraKathleen Rice (Dr. Josiah LoboJohn McFadden, MD) 4. OB/GYN: Dr. Clint LippsKalpen Patel, [facility], phone [phone], fax [phone]  REVIEW OF SYSTEMS: She has no other issues involving her other body systems.    PHYSICAL EXAM: BP 125/78 mmHg  Pulse 99  Wt 263 lb (119.296 kg) She  has gained 1 pound since last visit.  Constitutional: The patient is alert. She still appears tired today. She is very obese. She does not look or act depressed today. Eyes: There is no arcus or proptosis. Ocular moisture appears normal. Mouth: The oropharynx appears normal. The tongue appears normal. There is normal oral moisture.  Neck: There are no bruits present. The thyroid gland appears normal in size. The thyroid gland is again normal at 20 grams in size. The lobes are not enlarged today. The consistency of the thyroid gland is normal. The thyroid gland is nontender today.    Lungs: The lungs are clear. Air movement is good. Heart: The heart rhythm and rate appear normal. Heart sounds S1 and S2 are normal. I do not appreciate any pathologic heart murmurs. Abdomen: The abdomen  is quite enlarged. Bowel sounds are normal. The abdomen is soft. Her abdomen was not tender to palpation today.    Arms: Muscle mass appears appropriate for age and gender. Hands: There is no obvious tremor. Phalangeal and metacarpophalangeal joints appear normal. Palms are normal. Legs: Muscle mass appears appropriate for age and gender. There is no edema of her shins today.  Neurologic: Muscle strength is normal for age and gender in both the upper and the lower extremities. Muscle tone appears normal. Sensation to touch is normal in the legs.  LAB DATA: HbA1c today is 5.6%, compared with 5.6% at last visit and 5.3% at the prior visit.   Labs 09/22/14: 25-OH vitamin D 26,7  Labs 05/22/14: TSH 1.922, free T4 1.40, free T3 2.9; 25-hydroxy vitamin D 26  Labs 11/30/13: TSH 4.445, free T4 1.30, free T3 2.4  Labs 03/23/13: TSH 6.644, free T4 1.30, free T3 2.5; 25-hydroxy Vitamin D 27; calcium 9.4;  estradiol 145.8,   Labs 12/28/12: TSH 4.18, free T4 1.5, free T3 2.4  Labs 07/22/12: HbA1c 5.6%, TSH 0.101, free T4 1.51, free T3 2.9 (on Synthroid 137 mcg/day)  ASSESSMENT:  1. Hypothyroid: She was hypothyroid in  December 2014 and again in April 2015, but was euthyroid in October 2015 on her Synthroid dose of 200 mcg/day.  2. Obesity: Her weight increased since starting Depo-provera and stopping exercise. She has also done a large amount of comfort eating since last visit. She would like us to find the "hidden metabolic cause" of her obesity.  3. Goiter: Her thyroid gland is again normal in size. The waxing and waning of thyroid gland size is c/w ongoing thyroiditis. 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  4. Hypertension: Her BP is good today.   5. Dyspepsia: This problem has remained the same.  6. Pre-diabetes: Her A1c is again at the upper limit of normal.   7. Hashimoto's disease: Her thyroiditis is clinically quiescent today.   8. Hair loss: This problem has essentially resolved.  9. Secondary amenorrhea: She  is now having menses since being off Depo-provera.  She is at risk for accelerated bone loss, especially if her calcium and vitamin D intake are not robust enough. She is taking Centrum Silver and vitamin D.  10. OSA, Fatigue/snoring,apnea: Most of her fatigue/snoring/apnea is due to obstructive sleep apnea. She needs to begin C-pap therapy.  11. Acanthosis, nigricans: This is worse, indicating more resistance to insulin and hyperinsulinemia as she's gained weight.  12. Vitamin D deficiency disease: She needs to be consistent with taking vitamin D.   PLAN:  1. Diagnostic: TFTs, CBC, iron, B12., lipid panel, 25-OH vitamin D, and CMP before her next visit.  2. Therapeutic: Continue Synthroid at 200 mcg/day for now. Continue MVI, vitamin D, and metformin, 500 mg, twice daily.  3. Patient Education: It is important to eat right and to exercise right when she can. We need to address the different causes of her metabolic syndrome. I mentioned bariatric surgery  to her today as a possible therapeutic intervention if eating right and exercising right are not enough. She is very resistant to the idea of surgery.  4. Follow-up: three months.  Level of Service: This visit lasted in excess of 55 minutes. More than 50% of the visit was devoted to counseling.  David Stall

## 2014-12-28 ENCOUNTER — Other Ambulatory Visit: Payer: Self-pay | Admitting: "Endocrinology

## 2014-12-29 ENCOUNTER — Other Ambulatory Visit: Payer: Self-pay | Admitting: *Deleted

## 2015-02-05 ENCOUNTER — Telehealth: Payer: Self-pay | Admitting: "Endocrinology

## 2015-02-05 NOTE — Telephone Encounter (Signed)
1. The patient's husband called. Ana Murphy is vacationing in [place], but she is having problems. She feels very bloated. Her legs and ankles are very swollen and painful. She saw a doctor in [place], who prescribed a diuretic. Those fluid pills don't seem to be working. She wants my input. 2. I reviewed her last visit on 12/26/14. She did not have swelling at that time. Her most recent CMP was in October. Her kidney studies were good at that time. She did not get her labs done after the last visit as I had requested, so I have no new lab data to guide any new recommendations. 3. I suggested that she return to see her doctor in [place]. She will return to the [place] on 03[place]06/15. David StallBRENNAN,Ori Trejos J

## 2015-03-15 ENCOUNTER — Encounter: Payer: Self-pay | Admitting: Internal Medicine

## 2015-03-15 ENCOUNTER — Ambulatory Visit (INDEPENDENT_AMBULATORY_CARE_PROVIDER_SITE_OTHER): Payer: BLUE CROSS/BLUE SHIELD | Admitting: Internal Medicine

## 2015-03-15 ENCOUNTER — Encounter: Payer: Self-pay | Admitting: "Endocrinology

## 2015-03-15 ENCOUNTER — Ambulatory Visit (INDEPENDENT_AMBULATORY_CARE_PROVIDER_SITE_OTHER): Payer: BLUE CROSS/BLUE SHIELD | Admitting: "Endocrinology

## 2015-03-15 VITALS — BP 128/80 | HR 76 | Wt 261.0 lb

## 2015-03-15 VITALS — BP 142/88 | HR 80 | Ht 68.0 in | Wt 264.2 lb

## 2015-03-15 DIAGNOSIS — R7309 Other abnormal glucose: Secondary | ICD-10-CM

## 2015-03-15 DIAGNOSIS — E063 Autoimmune thyroiditis: Secondary | ICD-10-CM | POA: Diagnosis not present

## 2015-03-15 DIAGNOSIS — E038 Other specified hypothyroidism: Secondary | ICD-10-CM

## 2015-03-15 DIAGNOSIS — E049 Nontoxic goiter, unspecified: Secondary | ICD-10-CM

## 2015-03-15 DIAGNOSIS — I10 Essential (primary) hypertension: Secondary | ICD-10-CM

## 2015-03-15 DIAGNOSIS — G4733 Obstructive sleep apnea (adult) (pediatric): Secondary | ICD-10-CM

## 2015-03-15 DIAGNOSIS — R5383 Other fatigue: Secondary | ICD-10-CM

## 2015-03-15 DIAGNOSIS — E669 Obesity, unspecified: Secondary | ICD-10-CM | POA: Diagnosis not present

## 2015-03-15 DIAGNOSIS — R7303 Prediabetes: Secondary | ICD-10-CM

## 2015-03-15 DIAGNOSIS — E559 Vitamin D deficiency, unspecified: Secondary | ICD-10-CM

## 2015-03-15 LAB — POCT GLYCOSYLATED HEMOGLOBIN (HGB A1C)
HEMOGLOBIN A1C: 8
Hemoglobin A1C: 5.4

## 2015-03-15 LAB — GLUCOSE, POCT (MANUAL RESULT ENTRY): POC GLUCOSE: 84 mg/dL (ref 70–99)

## 2015-03-15 NOTE — Progress Notes (Signed)
CC: FU of hypothyroid, Hashimoto's disease, goiter, obesity, hypertension, edema, dyspepsia, pre-diabetes, hyperlipidemia, oligomenorrhea, fatigue.  HISTORY OF PRESENT ILLNESS: 44 y.o. Caucasian woman   1. Ana Murphy had her first endocrine consultation with me on or prior to 07/26/10. She had been given a diagnosis of hyperlipidemia about 25 years ago. In the past few years that I've been her endocrinologist she has firmly but politely declined to take any lipid-lowering agents. She was slender until about 1998, then began gaining weight. She developed hypothyroidism secondary to Hashimoto's Thyroiditis in the 2007-2008 period. She started Synthroid in October 2008. As she has lost more thyrocytes over time we have gradually increased her Synthroid dose. She had  been taking metformin, 500 mg, twice daily for treatment of obesity and pre-diabetes, except during the time that she was pregnant and was breast-feeding in 2013-2014. As she has gained more weight she has developed obstructive sleep apnea (OSA).   2. Her last [facility] visit was on 12/26/14. I continued her Synthroid dose of 200 mcg/day and her metformin dose of 500 mg, twice daily. .   A. In the interim she had an episode of severe pedal edema while vacationing in [place]. She also saw a dietitian who put her on a diet.   B. She saw Dr. Fannie Knee in Pulmonology this morning. Her CPAP mask worked well for the first month, but then began to vibrate and wake her up. She needs to see the CPAP technician to adjust the CPAP machine and mask. Since beginning CPAP therapy she has been much less tired and has had much more energy.    C. She is not walking much due to her left sciatica and pains and burning in both thighs. She continues to have a variety of aching pains in her legs, groin, and other areas of her body. The pains can involve both joints and muscles. When she has pains or feels tired she won't exercise.   D. 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   E. She stopped her Depo-Provera in February. Her LMP was on July 15th. She does not have many hot flashes.   F. She says that she is eating more vegetables and is doing better about reducing her intake of sweets, pasta, bread, and ice cream.    3. Pertinent Review of Systems:  Constitutional:  She feels "better overall". Eyes: Vision is good with her glasses. Her eye pressure was high at her last exam. She will be re-checked in 3 months There are no significant eye complaints. Neck: The patient denies any problems with her anterior neck.  Heart: She no longer has a fast heart rate. The patient has no other complaints of palpitations, irregular heat beats, chest pain, or chest pressure. Gastrointestinal:  Bowel movents are more regular due to her high consumption of vegetables. She has no complaints of acid reflux, stomach aches or pains, or diarrhea. Arms: Arms sometimes get numb if she has pain in the posterior neck Legs: Lots of legs and pains. Muscle mass and strength seem normal. Her left leg has been  bothering her more frequently due to sciatica. No edema is noted. Feet: She is no longer having much pain at the site of her previous right foot fracture. There are no other. complaints of numbness, tingling, burning, or pain. No edema is noted. GYN: As above  PAST MEDICAL, FAMILY, AND SOCIAL HISTORY: 1. Work and family: She is changing to an Designer, industrial/product job at [employer]  2.  Activities: Very little   3. Primary care provider: Dr. Montey Hora (Dr. Josiah Lobo, MD) 4. OB/GYN: Dr. Clint Lipps, [facility], phone [phone], fax [phone]  REVIEW OF SYSTEMS: She has no other issues involving her other body systems.    PHYSICAL EXAM: BP 128/80 mmHg  Pulse 76  Wt 261 lb (118.389 kg) She has lost 2 pounds since last visit.  Constitutional: The patient is alert and bright.  She is upbeat and happy. She smiles a lot and jokes a lot. She does not look or act  depressed today. She is still very obese. Eyes: There is no arcus or proptosis. Ocular moisture appears normal. Mouth: The oropharynx appears normal. The tongue appears normal. There is normal oral moisture.  Neck: There are no bruits present. The thyroid gland appears normal in size. The thyroid gland is again normal at 20 grams in size. The lobes are not enlarged today. The consistency of the thyroid gland is normal. The thyroid gland is nontender today.    Lungs: The lungs are clear. Air movement is good. Heart: The heart rhythm and rate appear normal. Heart sounds S1 and S2 are normal. I do not appreciate any pathologic heart murmurs. Abdomen: The abdomen  is quite enlarged. Bowel sounds are normal. The abdomen is soft. Her abdomen was not tender to palpation today.    Arms: Muscle mass appears appropriate for age and gender. Hands: There is no obvious tremor. Phalangeal and metacarpophalangeal joints appear normal. Palms are normal. Legs: Muscle mass appears appropriate for age and gender. There is no edema of her shins today. Deep palpation of her shins produces some pain.  Neurologic: Muscle strength is normal for age and gender in both the upper and the lower extremities. Muscle tone appears normal. Sensation to touch is normal in the legs.  LAB DATA:   Labs 03/15/15: HbA1c 5.4%  Labs 03/13/15: CK 105 (normal 24-173); CMP normal; iron 51; vitamin B12 589.4 (normal 211-946); PTH 28.9 (Normal 15-65); Free T4 1.64  Labs 12/26/14: HbA1c 5.6%, compared with 5.6% at last visit and 5.3% at the prior visit.   Labs 09/22/14: 25-OH vitamin D 26.7  Labs 05/22/14: TSH 1.922, free T4 1.40, free T3 2.9; 25-hydroxy vitamin D 26  Labs 11/30/13: TSH 4.445, free T4 1.30, free T3 2.4  Labs 03/23/13: TSH 6.644, free T4 1.30, free T3 2.5; 25-hydroxy Vitamin D 27; calcium 9.4;  estradiol 145.8,   Labs 12/28/12: TSH 4.18, free T4 1.5, free T3 2.4  Labs 07/22/12: HbA1c 5.6%, TSH 0.101, free T4 1.51, free T3  2.9 (on Synthroid 137 mcg/day)  ASSESSMENT:  1. Hypothyroid: She was hypothyroid in December 2014 and again in April 2015, but was euthyroid in October 2015 on her Synthroid dose of 200 mcg/day. According to her free T4 earlier this week, she is again euthyroid, but we don't have an accompanying TSH value.  2. Obesity: Her weight decreased 2 pounds since last visit. She would like [place] to find the "hidden metabolic cause" of her obesity.  3. Goiter: Her thyroid gland is again normal in size. The waxing and waning of thyroid gland size is c/w ongoing thyroiditis. 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  4. Hypertension: Her BP is fairly good today.   5. Dyspepsia: This problem has remained the same.  6. Pre-diabetes: Her A1c is better today.    7.  Hashimoto's disease: Her thyroiditis is clinically quiescent today.   8. Secondary amenorrhea: She is now having menses since being off Depo-provera.  She is still at risk for accelerated bone loss, especially if her calcium and vitamin D intake are not robust enough. She is supposed to be taking Centrum Silver and vitamin D, but often forgets.   9. OSA, Fatigue/snoring,apnea: She is doing much better on CPAP therapy.  10. Acanthosis, nigricans: This is about the same, indicating ongoing resistance to insulin and hyperinsulinemia as she's gained weight.  12. Vitamin D deficiency disease: She needs to be consistent with taking vitamin D.   PLAN:  1. Diagnostic: HbA1c today and again at her next visit.  2. Therapeutic: Continue Synthroid at 200 mcg/day; metformin, 500 mg, twice daily; MVI daily; and vitamin D daily  3. Patient Education: It is important to eat right and to exercise right when she can. We need to address the different causes of her metabolic syndrome. I asked her again to consider the option of  bariatric surgery as a possible therapeutic intervention if eating right and exercising right are not enough. She is very resistant to the idea of surgery and very politely, but firmly, rejected my request.  4. Follow-up: three months.  Level of Service: This visit lasted in excess of 55 minutes. More than 50% of the visit was devoted to counseling.  David Stall

## 2015-03-15 NOTE — Patient Instructions (Signed)
Follow up visit in 3 months. 

## 2015-03-15 NOTE — Patient Instructions (Signed)
Order- DME [facility]- change CPAP to fixed pressure 15,                                Please work with patient for better mask fit and seal                               Please teach how to adjust Ramp                               Download for pressure compliance documentation  Continue Flonase/ fluticasone nasal spray- 1-2 puffs each nostril once daily at bedtime to help with allergies. We have other treatments if needed later on.  Please call if needed

## 2015-03-15 NOTE — Progress Notes (Signed)
03/15/15- 44 yoF never smoker from [place] for OSA.on kind referral from Dr Fransico Michael; had sleep study 11-2014 and 12-2014; first month with CPAP was able to sleep all night; then the mask started making noises(not a good seal) and wakes up from it. DME is [facility]. Husband has told her she stops breathing and snores loudly. She has been aware of daytime sleepiness and weight gain. She sought evaluation for hypothyroidism. Sleep habits have been unremarkable as reviewed. ENT-septoplasty. No heart or lung disease. Mild perennial allergic rhinitis treated with Flonase nasal spray. NPSG 11/30/14- AHI 100.5/ hr. Weight 262 lbs. CPAP titrated 12/11/14-15 CWP, AHI 0.0 per hour. She has been using an auto titration machine with fullface mask/APS.  Prior to Admission medications   Medication Sig Start Date End Date Taking? Authorizing Provider  metFORMIN (GLUCOPHAGE) 500 MG tablet Take 1 tablet (500 mg total) by mouth 2 (two) times daily with a meal. 05/29/14 05/30/15 Yes David Stall, MD  Multiple Vitamin (MULTIVITAMIN) tablet Take 1 tablet by mouth daily.     Yes Historical Provider, MD  naproxen (NAPROSYN) 125 MG/5ML suspension Take by mouth 2 (two) times daily.   Yes Historical Provider, MD  SYNTHROID 200 MCG tablet TAKE 1 TABLET (200 MCG TOTAL) BY MOUTH DAILY BEFORE BREAKFAST. 12/29/14  Yes David Stall, MD  VITAMIN D, CHOLECALCIFEROL, PO Take by mouth.   Yes Historical Provider, MD   Past Medical History  Diagnosis Date  . Hypothyroidism, acquired, autoimmune   . Goiter   . Thyroiditis, autoimmune   . Obesity   . Pre-diabetes   . Hyperlipidemia type II   . Dyspepsia   . Fatigue   . Back pain    Past Surgical History  Procedure Laterality Date  . Nose surgery    . Dilation and curettage of uterus     Family History  Problem Relation Age of Onset  . Hypothyroidism Mother   . Hypothyroidism Brother   . Heart disease Maternal Grandfather    /soc ROS-see HPI   Negative unless  "+" Constitutional:    weight loss, night sweats, fevers, chills, fatigue, lassitude. HEENT:    headaches, difficulty swallowing, tooth/dental problems, sore throat,       sneezing, itching, ear ache, nasal congestion, post nasal drip, snoring CV:    chest pain, orthopnea, PND, swelling in lower extremities, anasarca,                                                  dizziness, palpitations Resp:   shortness of breath with exertion or at rest.                productive cough,   non-productive cough, coughing up of blood.              change in color of mucus.  wheezing.   Skin:    rash or lesions. GI:  No-   heartburn, indigestion, abdominal pain, nausea, vomiting, diarrhea,                 change in bowel habits, loss of appetite GU: dysuria, change in color of urine, no urgency or frequency.   flank pain. MS:   joint pain, stiffness, decreased range of motion, back pain. Neuro-     nothing unusual Psych:  change in mood or affect.  depression or  anxiety.   memory loss.  OBJ- Physical Exam General- Alert, Oriented, Affect-appropriate, Distress- none acute, overweight  Skin- rash-none, lesions- none, excoriation- none Lymphadenopathy- none Head- atraumatic            Eyes- Gross vision intact, PERRLA, conjunctivae and secretions clear            Ears- Hearing, canals-normal            Nose- Clear, no-Septal dev, mucus, polyps, erosion, perforation             Throat- Mallampati III , mucosa clear , drainage- none, tonsils- atrophic Neck- flexible , trachea midline, no stridor , thyroid nl, carotid no bruit Chest - symmetrical excursion , unlabored           Heart/CV- RRR , no murmur , no gallop  , no rub, nl s1 s2                           - JVD- none , edema- none, stasis changes- none, varices- none           Lung- clear to P&A, wheeze- none, cough- none , dullness-none, rub- none           Chest wall-  Abd-  Br/ Gen/ Rectal- Not done, not indicated Extrem- cyanosis- none,  clubbing, none, atrophy- none, strength- nl Neuro- grossly intact to observation

## 2015-03-18 NOTE — Assessment & Plan Note (Signed)
Obese hypertensive woman with OSA. We discussed the medical concerns and available treatments, encouraging weight loss and emphasizing her driving responsibility. We will work to get CPAP more comfortable for her, including better mask fit. Plan-DME company to work with patient as outlined.

## 2015-04-04 ENCOUNTER — Encounter: Payer: Self-pay | Admitting: Internal Medicine

## 2015-06-05 ENCOUNTER — Other Ambulatory Visit: Payer: Self-pay | Admitting: "Endocrinology

## 2015-07-02 ENCOUNTER — Telehealth: Payer: Self-pay | Admitting: "Endocrinology

## 2015-07-02 NOTE — Telephone Encounter (Signed)
Routed to provider

## 2015-07-09 ENCOUNTER — Telehealth: Payer: Self-pay | Admitting: "Endocrinology

## 2015-07-09 NOTE — Telephone Encounter (Signed)
1. She has had several episodes of symptoms of low BGs during the past 1-2 weeks. She felt low about two hours after taking metformin at breakfast in the mornings.She felt hungry and light-headed. She checked her BG only once on last Sunday a week go. The BG was 67. Her symptoms were relieved by taking juice.   Since stopping the metformin at breakfast, the low BG symptoms have resolved. In retrospect she has also had some pains in her neck for more than two weeks and has been taking an anti-inflammatory medication, naproxen, for a week or so. She has had nausea and vertigo for the past several days. She went to see her PCP, who started her on meclizine. 2. It's unclear whether the metformin alone caused her low BGs, because the medication had never causes such symptoms before. Perhaps the combination of trying to diet and the metformin cause problems. It is also possible that the naproxen caused some gastritis. She also has a new URI with vertigo.  3. Continue the evening dose of metformin for now. After the vertigo and the need for Naproxen resolve, then re-start the morning dose of metformin and check BGs whenever she suspects that she is having low BGs.  4. Call us again when she thinks that she is ready to re-start the metformin.  David StallBRENNAN,Sanae Willetts J

## 2015-07-12 NOTE — Telephone Encounter (Signed)
While trying to contact the patient I lost contact with the telephone note function.

## 2015-07-20 NOTE — Telephone Encounter (Signed)
Handled by provider.Emily M Hull °

## 2015-07-23 ENCOUNTER — Encounter: Payer: Self-pay | Admitting: Internal Medicine

## 2015-07-23 ENCOUNTER — Ambulatory Visit (INDEPENDENT_AMBULATORY_CARE_PROVIDER_SITE_OTHER): Payer: BLUE CROSS/BLUE SHIELD | Admitting: Internal Medicine

## 2015-07-23 VITALS — BP 116/74 | HR 79 | Ht 68.0 in | Wt 249.0 lb

## 2015-07-23 DIAGNOSIS — G4733 Obstructive sleep apnea (adult) (pediatric): Secondary | ICD-10-CM

## 2015-07-23 NOTE — Progress Notes (Signed)
03/15/15- 44 yoF never smoker from [place] establishing for OSA.on kind referral from Dr Fransico MichaelBrennan; had sleep study 11-2014 and 12-2014; first month with CPAP was able to sleep all night; then the mask started making noises(not a good seal) and wakes up from it. DME is [facility]. Husband has told her she stops breathing and snores loudly. She has been aware of daytime sleepiness and weight gain. She sought evaluation for hypothyroidism. Sleep habits have been unremarkable as reviewed. ENT-septoplasty. No heart or lung disease. Mild perennial allergic rhinitis treated with Flonase nasal spray. NPSG 11/30/14- AHI 100.5/ hr. Weight 262 lbs. CPAP titrated 12/11/14-15 CWP, AHI 0.0 per hour. She has been using an auto titration machine with fullface mask/[facility].  07/23/2015-44 year old female never smoker from [place] followed for OSA, complicated by obesity, autoimmune thyroiditis, HBP  CPAP 15/[facility] FOLLOWS FOR Pt states that CPAP pressure is too strong at beginning of sleep. Pt states that she is having leaks with mask lately. Pt states using CPAP every night for at leasr 6-7 hours. No other complaints voiced     ROS-see HPI   Negative unless "+" Constitutional:    weight loss, night sweats, fevers, chills, fatigue, lassitude. HEENT:    headaches, difficulty swallowing, tooth/dental problems, sore throat,       sneezing, itching, ear ache, nasal congestion, post nasal drip, snoring CV:    chest pain, orthopnea, PND, swelling in lower extremities, anasarca,                                                  dizziness, palpitations Resp:   shortness of breath with exertion or at rest.                productive cough,   non-productive cough, coughing up of blood.              change in color of mucus.  wheezing.   Skin:    rash or lesions. GI:  No-   heartburn, indigestion, abdominal pain, nausea, vomiting, diarrhea,                 change in bowel habits, loss of appetite GU: dysuria, change in color of urine, no urgency  or frequency.   flank pain. MS:   joint pain, stiffness, decreased range of motion, back pain. Neuro-     nothing unusual Psych:  change in mood or affect.  depression or anxiety.   memory loss.  OBJ- Physical Exam General- Alert, Oriented, Affect-appropriate, Distress- none acute, overweight  Skin- rash-none, lesions- none, excoriation- none Lymphadenopathy- none Head- atraumatic            Eyes- Gross vision intact, PERRLA, conjunctivae and secretions clear            Ears- Hearing, canals-normal            Nose- Clear, no-Septal dev, mucus, polyps, erosion, perforation             Throat- Mallampati III , mucosa clear , drainage- none, tonsils- atrophic Neck- flexible , trachea midline, no stridor , thyroid nl, carotid no bruit Chest - symmetrical excursion , unlabored           Heart/CV- RRR , no murmur , no gallop  , no rub, nl s1 s2                           -  JVD- none , edema- none, stasis changes- none, varices- none           Lung- clear to P&A, wheeze- none, cough- none , dullness-none, rub- none           Chest wall-  Abd-  Br/ Gen/ Rectal- Not done, not indicated Extrem- cyanosis- none, clubbing, none, atrophy- none, strength- nl Neuro- grossly intact to observation

## 2015-07-23 NOTE — Patient Instructions (Signed)
Order-DME [facility]  reduce CPAP to 14 CWP, also please instruct use of Ramp feature    Dx OSA  Please call as needed

## 2015-07-25 ENCOUNTER — Telehealth: Payer: Self-pay | Admitting: "Endocrinology

## 2015-07-25 NOTE — Telephone Encounter (Signed)
Routed to provider,(Just FYI)

## 2015-07-31 ENCOUNTER — Ambulatory Visit (INDEPENDENT_AMBULATORY_CARE_PROVIDER_SITE_OTHER): Payer: BLUE CROSS/BLUE SHIELD | Admitting: "Endocrinology

## 2015-07-31 ENCOUNTER — Encounter: Payer: Self-pay | Admitting: "Endocrinology

## 2015-07-31 VITALS — BP 131/84 | HR 76 | Wt 247.4 lb

## 2015-07-31 DIAGNOSIS — E049 Nontoxic goiter, unspecified: Secondary | ICD-10-CM

## 2015-07-31 DIAGNOSIS — E063 Autoimmune thyroiditis: Secondary | ICD-10-CM | POA: Diagnosis not present

## 2015-07-31 DIAGNOSIS — R1013 Epigastric pain: Secondary | ICD-10-CM

## 2015-07-31 DIAGNOSIS — R7303 Prediabetes: Secondary | ICD-10-CM

## 2015-07-31 DIAGNOSIS — E669 Obesity, unspecified: Secondary | ICD-10-CM

## 2015-07-31 DIAGNOSIS — E038 Other specified hypothyroidism: Secondary | ICD-10-CM | POA: Diagnosis not present

## 2015-07-31 DIAGNOSIS — I10 Essential (primary) hypertension: Secondary | ICD-10-CM

## 2015-07-31 DIAGNOSIS — G4733 Obstructive sleep apnea (adult) (pediatric): Secondary | ICD-10-CM

## 2015-07-31 LAB — POCT GLYCOSYLATED HEMOGLOBIN (HGB A1C): Hemoglobin A1C: 5.3

## 2015-07-31 LAB — GLUCOSE, POCT (MANUAL RESULT ENTRY): POC Glucose: 120 mg/dl — AB (ref 70–99)

## 2015-07-31 NOTE — Progress Notes (Signed)
CC: FU of hypothyroid, Hashimoto's disease, goiter, obesity, hypertension, edema, dyspepsia, pre-diabetes, hyperlipidemia, oligomenorrhea, fatigue.  HISTORY OF PRESENT ILLNESS: 44 y.o. Caucasian woman   1. Ana Murphy had her first endocrine consultation with me on or prior to 07/26/10. She had been given a diagnosis of hyperlipidemia about 25 years ago. In the past few years that I've been her endocrinologist she has firmly but politely declined to take any lipid-lowering agents. She was slender until about 1998, then began gaining weight. She developed hypothyroidism secondary to Hashimoto's Thyroiditis in the 2007-2008 period. She started Synthroid in October 2008. As she has lost more thyrocytes over time we have gradually increased her Synthroid dose. She had  been taking metformin, 500 mg, twice daily for treatment of obesity and pre-diabetes, except during the time that she was pregnant and was breast-feeding in 2013-2014. As she has gained more weight she has developed obstructive sleep apnea (OSA). She now uses a C-pap machine.   2. Her last [facility] visit was on 03/15/15. I continued her Synthroid dose of 200 mcg/day and her metformin dose of 500 mg, twice daily.    A. After Thanksgiving she had posterior neck and shoulder pains. She also had nausea and motion sickness. She went to a neurologist, who gave her a steroid injection. She was asymptomatic for about two weeks, but her symptoms recurred. She is taking meclizine now, but only about once per day. The neurologist wants her to see a physical therapist who specializes in inner ear problems.   B. She saw Dr. Fannie Knee in Pulmonology last week. Her new C-pap machine is working well. She feels more rested and more energetic.   C. Her sciatica comes and goes, sometimes on the right and sometimes on the left. When she has pains or feels tired she won't exercise.   D. 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   E. She still has occasional hot  flashes.   F. She says that she is eating more vegetables and is doing better about reducing her intake of sweets, pasta, bread, and ice cream.    3. Pertinent Review of Systems:  Constitutional:  She feels "better overall". Eyes: Vision is good with her glasses. Her eye pressure was high at her last exam. She will be re-checked in 3 months There are no significant eye complaints. Neck: The patient denies any problems with her anterior neck.  Heart: She no longer has a fast heart rate. The patient has no other complaints of palpitations, irregular heat beats, chest pain, or chest pressure. Gastrointestinal:  Bowel movents are more regular due to her high consumption of vegetables. She has no complaints of acid reflux, stomach aches or pains, or diarrhea. Arms: Arms sometimes get numb if she has pain in the posterior neck Legs: Lots of legs and pains. Muscle mass and strength seem normal. Her sciatica comes and goes, sometimes worse on one side than the other. No edema is noted. Feet: She is no longer having much pain at the site of her previous right foot fracture. There are no other. complaints of numbness, tingling, burning, or pain. No edema is noted. GYN: As above  PAST MEDICAL, FAMILY, AND SOCIAL HISTORY: 1. Work and family: She has changed to an Designer, industrial/product job at [employer]  2. Activities: Very little   3. Primary care provider: Dr. Montey Hora (Dr. Josiah Lobo, MD) 4. OB/GYN: Dr. Clint Lipps, [facility], phone [phone], fax [phone]  REVIEW OF SYSTEMS: She has  no other issues involving her other body systems.    PHYSICAL EXAM: BP 131/84 mmHg  Pulse 76  Wt 247 lb 6.4 oz (112.22 kg) She has lost 14 pounds since last visit.  Constitutional: The patient is alert and bright.  She is upbeat and happy. She smiles a lot and jokes a lot. She does not look or act depressed today. She is still very obese. Eyes: There is no arcus or proptosis. Ocular moisture appears  normal. Mouth: The oropharynx appears normal. The tongue appears normal. There is normal oral moisture.  Neck: There are no bruits present. The thyroid gland appears normal in size. The thyroid gland is again normal or only slightly enlarged at 20+ grams in size. The isthmus is enlarged, but the lobes are not much enlarged today. The consistency of the thyroid gland is normal. The thyroid gland is nontender today.    Lungs: The lungs are clear. Air movement is good. Heart: The heart rhythm and rate appear normal. Heart sounds S1 and S2 are normal. I do not appreciate any pathologic heart murmurs. Abdomen: The abdomen  is quite enlarged. Bowel sounds are normal. The abdomen is soft. Her abdomen is not tender to palpation today.    Arms: Muscle mass appears appropriate for age and gender. Hands: There is no obvious tremor. Phalangeal and metacarpophalangeal joints appear normal. Palms are normal. Legs: Muscle mass appears appropriate for age and gender. There is no edema of her shins today. Deep palpation of her shins produces some pain.  Neurologic: Muscle strength is normal for age and gender in both the upper and the lower extremities. Muscle tone appears normal. Sensation to touch is normal in the legs. GYN: Her LMP was 06/27/15. Periods are fairly regular.  LAB DATA:   Labs 12/20/6: HbA1c 5.3%  Labs 03/15/15: HbA1c 5.4%  Labs 03/13/15: CK 105 (normal 24-173); CMP normal; iron 51; vitamin B12 589.4 (normal 211-946); PTH 28.9 (Normal 15-65); Free T4 1.64  Labs 12/26/14: HbA1c 5.6%, compared with 5.6% at last visit and 5.3% at the prior visit.   Labs 09/22/14: 25-OH vitamin D 26.7  Labs 05/22/14: TSH 1.922, free T4 1.40, free T3 2.9; 25-hydroxy vitamin D 26  Labs 11/30/13: TSH 4.445, free T4 1.30, free T3 2.4  Labs 03/23/13: TSH 6.644, free T4 1.30, free T3 2.5; 25-hydroxy Vitamin D 27; calcium 9.4;  estradiol 145.8,   Labs 12/28/12: TSH 4.18, free T4 1.5, free T3 2.4  Labs 07/22/12: HbA1c  5.6%, TSH 0.101, free T4 1.51, free T3 2.9 (on Synthroid 137 mcg/day)  ASSESSMENT:  1. Hypothyroid: She was hypothyroid in December 2014 and again in April 2015, but was euthyroid in October 2015 on her Synthroid dose of 200 mcg/day. According to her free T4 from an outside lab at her last visit, she was again euthyroid, but we did not have an accompanying TSH value.  2. Obesity: Her weight decreased 14 pounds since last visit. She would still like us to find the "hidden metabolic cause" of her obesity.  3. Goiter: Her thyroid gland is again normal in size. The waxing and waning of thyroid gland size is c/w ongoing thyroiditis. 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  4. Hypertension: Her BP is better, but still too high.    5. Dyspepsia: This problem has improved.   6. Pre-diabetes: Her A1c is better today.  7. Hashimoto's disease: Her thyroiditis is clinically quiescent today.   8. Secondary amenorrhea: She is now having menses since being off Depo-provera.  She is still at risk for accelerated bone loss, especially if her calcium and vitamin D intake are not robust enough. She is supposed to be taking Centrum Silver and vitamin D, but often forgets.   9. OSA, Fatigue/snoring,apnea: She is doing much better on CPAP therapy.  10. Acanthosis, nigricans: This is about the same, indicating ongoing resistance to insulin and hyperinsulinemia due to her obesity. This problem could reverse over time if she loses enough fat weight.  12. Vitamin D deficiency disease: She stopped vitamin D on the advice of her PCP who told her that her vitamin D levels are normal.    PLAN:  1. Diagnostic: HbA1c today and again at her next visit. TFTs prior to next visit. 2. Therapeutic: Continue Synthroid at 200 mcg/day; metformin, 500 mg, twice daily; MVI daily. 3. Patient  Education: It is important to eat right and to exercise right when she can. We need to address the different causes of her metabolic syndrome. I asked her to keep up the good work of eating right and exercising more.  4. Follow-up: three months.  Level of Service: This visit lasted in excess of 45 minutes. More than 50% of the visit was devoted to counseling.  David Stall

## 2015-07-31 NOTE — Patient Instructions (Signed)
Follow up visit in 3 months. Please repeat lab tests about one week prior to next appointment.

## 2015-10-17 ENCOUNTER — Ambulatory Visit: Payer: BLUE CROSS/BLUE SHIELD | Admitting: "Endocrinology

## 2015-11-04 ENCOUNTER — Emergency Department (HOSPITAL_BASED_OUTPATIENT_CLINIC_OR_DEPARTMENT_OTHER): Payer: BLUE CROSS/BLUE SHIELD

## 2015-11-04 ENCOUNTER — Encounter (HOSPITAL_BASED_OUTPATIENT_CLINIC_OR_DEPARTMENT_OTHER): Payer: Self-pay | Admitting: *Deleted

## 2015-11-04 ENCOUNTER — Emergency Department (HOSPITAL_BASED_OUTPATIENT_CLINIC_OR_DEPARTMENT_OTHER)
Admission: EM | Admit: 2015-11-04 | Discharge: 2015-11-04 | Disposition: A | Discharge status: Home / Self Care | Payer: BLUE CROSS/BLUE SHIELD | Attending: Emergency Medicine | Admitting: Emergency Medicine

## 2015-11-04 DIAGNOSIS — Z791 Long term (current) use of non-steroidal anti-inflammatories (NSAID): Secondary | ICD-10-CM | POA: Insufficient documentation

## 2015-11-04 DIAGNOSIS — E669 Obesity, unspecified: Secondary | ICD-10-CM | POA: Insufficient documentation

## 2015-11-04 DIAGNOSIS — Z79899 Other long term (current) drug therapy: Secondary | ICD-10-CM | POA: Insufficient documentation

## 2015-11-04 DIAGNOSIS — N200 Calculus of kidney: Secondary | ICD-10-CM | POA: Diagnosis not present

## 2015-11-04 DIAGNOSIS — Z3202 Encounter for pregnancy test, result negative: Secondary | ICD-10-CM | POA: Diagnosis not present

## 2015-11-04 DIAGNOSIS — Z7984 Long term (current) use of oral hypoglycemic drugs: Secondary | ICD-10-CM | POA: Insufficient documentation

## 2015-11-04 DIAGNOSIS — E039 Hypothyroidism, unspecified: Secondary | ICD-10-CM | POA: Diagnosis not present

## 2015-11-04 DIAGNOSIS — M545 Low back pain: Secondary | ICD-10-CM | POA: Diagnosis present

## 2015-11-04 LAB — URINALYSIS, ROUTINE W REFLEX MICROSCOPIC
BILIRUBIN URINE: NEGATIVE
Glucose, UA: NEGATIVE mg/dL
HGB URINE DIPSTICK: NEGATIVE
KETONES UR: 15 mg/dL — AB
Leukocytes, UA: NEGATIVE
NITRITE: NEGATIVE
PH: 7 (ref 5.0–8.0)
Protein, ur: NEGATIVE mg/dL
SPECIFIC GRAVITY, URINE: 1.025 (ref 1.005–1.030)

## 2015-11-04 LAB — PREGNANCY, URINE: Preg Test, Ur: NEGATIVE

## 2015-11-04 MED ORDER — SODIUM CHLORIDE 0.9 % IV BOLUS (SEPSIS)
1000.0000 mL | Freq: Once | INTRAVENOUS | Status: AC
  Administered 2015-11-04: 1000 mL via INTRAVENOUS

## 2015-11-04 MED ORDER — HYDROMORPHONE HCL 1 MG/ML IJ SOLN
0.5000 mg | Freq: Once | INTRAMUSCULAR | Status: AC
  Administered 2015-11-04: 0.5 mg via INTRAVENOUS
  Filled 2015-11-04: qty 1

## 2015-11-04 MED ORDER — HYDROMORPHONE HCL 1 MG/ML IJ SOLN
1.0000 mg | Freq: Once | INTRAMUSCULAR | Status: AC
  Administered 2015-11-04: 1 mg via INTRAVENOUS
  Filled 2015-11-04: qty 1

## 2015-11-04 MED ORDER — DIAZEPAM 5 MG PO TABS
5.0000 mg | ORAL_TABLET | Freq: Once | ORAL | Status: AC
  Administered 2015-11-04: 5 mg via ORAL
  Filled 2015-11-04: qty 1

## 2015-11-04 MED ORDER — KETOROLAC TROMETHAMINE 30 MG/ML IJ SOLN
30.0000 mg | Freq: Once | INTRAMUSCULAR | Status: AC
  Administered 2015-11-04: 30 mg via INTRAMUSCULAR
  Filled 2015-11-04: qty 1

## 2015-11-04 MED ORDER — OXYCODONE-ACETAMINOPHEN 5-325 MG PO TABS: 1.0000 | ORAL_TABLET | Freq: Three times a day (TID) | ORAL | Status: DC | PRN

## 2015-11-04 NOTE — ED Notes (Signed)
Presents with back pain, states went to other MD received Prednisone, presents today with increased pain and very tearful, difficulty in ambulating

## 2015-11-04 NOTE — ED Notes (Signed)
Pt transported to car in w/c. Ambulated into car w/o difficulty.

## 2015-11-04 NOTE — Discharge Instructions (Signed)
Please read and follow all provided instructions.  Your diagnoses today include:  1. Nephrolithiasis    Tests performed today include:  Urine test that showed blood in your urine and no infection  CT scan which showed a 6 millimeter kidney on the left side  Blood test that showed normal kidney function  Vital signs. See below for your results today.   Medications prescribed:   Take any prescribed medications only as directed.  You have been prescribed a narcotic medication on an "as needed" basis. Take only as prescribed. Do not drive, operate any machinery or make any important decisions while taking this medication as it is sedating. It may cause constipation take over the counter stool softeners or add fiber to your diet to treat this (Metamucil, Psyllium Fiber, Colace, Miralax) Further refills will need to be obtained from your primary care doctor and will not be prescribed through the Emergency Department. You will test positive on most drug tests while taking this medciation.   Home care instructions:  Follow any educational materials contained in this packet.  Please double your fluid intake for the next several days. Strain your urine and save any stones that may pass.   BE VERY CAREFUL not to take multiple medicines containing Tylenol (also called acetaminophen). Doing so can lead to an overdose which can damage your liver and cause liver failure and possibly death.   Follow-up instructions: Please follow-up with your urologist or the urologist referral (provided on front page) as soon as possible for further evaluation of your symptoms.  If you need to return to the Emergency Department, go to [facility] and not [facility]. The urologists are located at [facility] and can better care for you at this location.  Return instructions:  If you need to return to the Emergency Department, go to [facility] and not [facility] . The urologists  are located at [facility] and can better care for you at this location.   Please return to the Emergency Department if you experience worsening symptoms.  Please return if you develop fever or uncontrolled pain or vomiting.  Please return if you have any other emergent concerns.  Additional Information:  Your vital signs today were: BP 143/71 mmHg   Pulse 105   Temp(Src) 98.1 F (36.7 C) (Oral)   Resp 20   Ht 5\' 7"  (1.702 m)   Wt 115.469 kg   BMI 39.86 kg/m2   SpO2 100% If your blood pressure (BP) was elevated above 135/85 this visit, please have this repeated by your doctor within one month. --------------

## 2015-11-04 NOTE — ED Provider Notes (Signed)
CSN: [id number]     Arrival date & time 11/04/15  1359 History   First MD Initiated Contact with Patient 11/04/15 1415     Chief Complaint  Patient presents with  . Back Pain   (Consider location/radiation/quality/duration/timing/severity/associated sxs/prior Treatment) HPI 45 y.o. female presents to the Emergency Department today complaining of left flank pain since yesterday morning. Does not endorse any trauma to the area. Notes 10/10 stretching sensation on lower left back with radiation to the groin. Saw her PCP that morning who gave her steroids and told her to take flexeril at home. Pt took medication with relief of symptoms. States that today around 10AM she had a return of the pain and felt worse. No fevers. No N/V/D. No headaches. No loss of bowel or bladder function. No numbness/tingling. Notes difficulty with ambulation due to pain, but is able to ambulate with assistance. No other symptoms noted.   Past Medical History  Diagnosis Date  . Hypothyroidism, acquired, autoimmune   . Goiter   . Thyroiditis, autoimmune   . Obesity   . Pre-diabetes   . Hyperlipidemia type II   . Dyspepsia   . Fatigue   . Back pain    Past Surgical History  Procedure Laterality Date  . Nose surgery    . Dilation and curettage of uterus     Family History  Problem Relation Age of Onset  . Hypothyroidism Mother   . Hypothyroidism Brother   . Heart disease Maternal Grandfather    Social History  Substance Use Topics  . Smoking status: Never Smoker   . Smokeless tobacco: Never Used  . Alcohol Use: No   OB History    No data available     Review of Systems ROS reviewed and all are negative for acute change except as noted in the HPI.  Allergies  Vicodin  Home Medications   Prior to Admission medications   Medication Sig Start Date End Date Taking? Authorizing Provider  metFORMIN (GLUCOPHAGE) 500 MG tablet TAKE 1 TABLET (500 MG TOTAL) BY MOUTH 2 (TWO) TIMES DAILY WITH A MEAL.  06/05/15   David StallMichael J Brennan, MD  Multiple Vitamin (MULTIVITAMIN) tablet Take 1 tablet by mouth daily.      Historical Provider, MD  naproxen (NAPROSYN) 125 MG/5ML suspension Take by mouth 2 (two) times daily. Reported on 07/31/2015    Historical Provider, MD  SYNTHROID 200 MCG tablet TAKE 1 TABLET (200 MCG TOTAL) BY MOUTH DAILY BEFORE BREAKFAST. 12/29/14   David StallMichael J Brennan, MD  VITAMIN D, CHOLECALCIFEROL, PO Take by mouth.    Historical Provider, MD   BP 138/97 mmHg  Pulse 114  Temp(Src) 98.1 F (36.7 C) (Oral)  Resp 20  Ht 5\' 7"  (1.702 m)  Wt 115.469 kg  BMI 39.86 kg/m2  SpO2 99%   Physical Exam  Constitutional: She is oriented to person, place, and time. She appears well-developed and well-nourished.  HENT:  Head: Normocephalic and atraumatic.  Eyes: EOM are normal. Pupils are equal, round, and reactive to light.  Neck: Normal range of motion. Neck supple. No tracheal deviation present.  Cardiovascular: Normal rate, regular rhythm and normal heart sounds.   No murmur heard. Pulmonary/Chest: Effort normal and breath sounds normal. No respiratory distress. She has no wheezes. She has no rales. She exhibits no tenderness.  Abdominal: Soft. There is no tenderness.  Musculoskeletal: Normal range of motion.       Lumbar back: Normal. She exhibits normal range of motion, no tenderness, no bony  tenderness, no swelling, no edema, no deformity and no pain.  TTP left lower flank. Limited ROM due to pain. Positive straight leg raise left side. Pt able to ambulate.   Neurological: She is alert and oriented to person, place, and time.  Skin: Skin is warm and dry.  Psychiatric: She has a normal mood and affect. Her behavior is normal. Thought content normal.  Nursing note and vitals reviewed.  ED Course  Procedures (including critical care time) Labs Review Labs Reviewed  URINALYSIS, ROUTINE W REFLEX MICROSCOPIC (NOT AT [facility]) - Abnormal; Notable for the following:    Ketones, ur 15 (*)     All other components within normal limits  PREGNANCY, URINE   Imaging Review Ct Renal Stone Study  11/04/2015  CLINICAL DATA:  Left flank pain radiating to left lower quadrant/groin, dysuria EXAM: CT ABDOMEN AND PELVIS WITHOUT CONTRAST TECHNIQUE: Multidetector CT imaging of the abdomen and pelvis was performed following the standard protocol without IV contrast. COMPARISON:  None. FINDINGS: Lower chest:  Lung bases are clear. Hepatobiliary: Mild hepatic steatosis. Gallbladder is notable for layering sludge (series 2/ image 26). No associated inflammatory changes. Pancreas: Within normal limits. Spleen: Within normal limits. Adrenals/Urinary Tract: Adrenal glands within normal limits. Right kidney is within normal limits. Mild left perirenal edema with nonspecific stranding. Mild left hydroureteronephrosis. 6 mm distal left ureteral calculus just above the UVJ (series 2/image 76). Bladder is underdistended but unremarkable. Stomach/Bowel: Stomach is within normal limits. No evidence of bowel obstruction. Normal appendix (series 2/image 59). Vascular/Lymphatic: No evidence of abdominal aortic aneurysm. No suspicious abdominopelvic lymphadenopathy. Reproductive: Uterus is within normal limits. Bilateral ovaries within normal limits Other: No abdominopelvic ascites. Musculoskeletal: Grade 1 spondylolisthesis at L4-5. IMPRESSION: 6 mm distal left ureteral calculus just above the UVJ. Mild left hydroureteronephrosis. Mild hepatic steatosis. Electronically Signed   By: Charline Bills M.D.   On: 11/04/2015 16:03   I have personally reviewed and evaluated these images and lab results as part of my medical decision-making.   EKG Interpretation None     MDM  I have reviewed and evaluated the relevant laboratory values. I have reviewed the relevant previous healthcare records. I obtained HPI from historian. Patient discussed with supervising physician  ED Course:  Assessment: 55y F patient with lower back  pain. No neurological deficits appreciated. Patient is ambulatory. No warning symptoms of back pain including: fecal incontinence, urinary retention or overflow incontinence, night sweats, waking from sleep with back pain, unexplained fevers or weight loss, h/o cancer, IVDU, recent trauma. No concern for cauda equina, epidural abscess, or other serious cause of back pain. Non TTP over lumbar spinous process. Pt able to rotate spine and ambulate. UA showed no signs of infection or hemoglobin suggestive of pyelonephritis or nephrolithiasis. Due to radiation into left flank and continued pain despite muscle relaxants in ED, a CT Renal was ordered to evaluate for nephrolithiasis, which came back with a 6mm left distal renal calculus above UVJ. UA non infectious. Afebrile. Pt feeling much better after analgesia and fluids. Will DC patient with PO analgesia and refer to Urology for further evaluation and treatment. At time of discharge, Patient is in no acute distress. Vital Signs are stable. Patient is able to ambulate. Patient able to tolerate PO.    Disposition/Plan:  DC home Additional Verbal discharge instructions given and discussed with patient.  Pt Instructed to f/u with PCP in the next 48 hours for evaluation and treatment of symptoms. Return precautions given Pt acknowledges and  agrees with plan  Supervising Physician Melene Plan, DO   Final diagnoses:  Nephrolithiasis       Audry Pili, PA-C 11/04/15 1555  Audry Pili, PA-C 11/04/15 1629  Melene Plan, DO 11/05/15 2144

## 2015-11-05 ENCOUNTER — Other Ambulatory Visit: Payer: Self-pay | Admitting: Urology

## 2015-11-05 ENCOUNTER — Encounter (HOSPITAL_COMMUNITY): Payer: Self-pay

## 2015-11-05 ENCOUNTER — Observation Stay (HOSPITAL_COMMUNITY)
Admission: AD | Admit: 2015-11-05 | Discharge: 2015-11-05 | Disposition: A | Discharge status: Home / Self Care | Payer: BLUE CROSS/BLUE SHIELD | Source: Ambulatory Visit | Attending: Urology | Admitting: Urology

## 2015-11-05 ENCOUNTER — Observation Stay (HOSPITAL_COMMUNITY): Payer: BLUE CROSS/BLUE SHIELD | Admitting: Anesthesiology

## 2015-11-05 ENCOUNTER — Encounter (HOSPITAL_COMMUNITY)
Admission: AD | Disposition: A | Discharge status: Home / Self Care | Payer: Self-pay | Source: Ambulatory Visit | Attending: Urology

## 2015-11-05 DIAGNOSIS — N201 Calculus of ureter: Secondary | ICD-10-CM | POA: Diagnosis not present

## 2015-11-05 DIAGNOSIS — Z79899 Other long term (current) drug therapy: Secondary | ICD-10-CM | POA: Insufficient documentation

## 2015-11-05 DIAGNOSIS — E063 Autoimmune thyroiditis: Secondary | ICD-10-CM | POA: Diagnosis not present

## 2015-11-05 DIAGNOSIS — E039 Hypothyroidism, unspecified: Secondary | ICD-10-CM | POA: Diagnosis not present

## 2015-11-05 DIAGNOSIS — K219 Gastro-esophageal reflux disease without esophagitis: Secondary | ICD-10-CM | POA: Diagnosis not present

## 2015-11-05 DIAGNOSIS — N200 Calculus of kidney: Secondary | ICD-10-CM

## 2015-11-05 DIAGNOSIS — G4733 Obstructive sleep apnea (adult) (pediatric): Secondary | ICD-10-CM | POA: Diagnosis not present

## 2015-11-05 DIAGNOSIS — I10 Essential (primary) hypertension: Secondary | ICD-10-CM | POA: Diagnosis not present

## 2015-11-05 DIAGNOSIS — E784 Other hyperlipidemia: Secondary | ICD-10-CM | POA: Diagnosis not present

## 2015-11-05 DIAGNOSIS — Z6841 Body Mass Index (BMI) 40.0 and over, adult: Secondary | ICD-10-CM | POA: Insufficient documentation

## 2015-11-05 DIAGNOSIS — Z7984 Long term (current) use of oral hypoglycemic drugs: Secondary | ICD-10-CM | POA: Insufficient documentation

## 2015-11-05 HISTORY — PX: CYSTOSCOPY/RETROGRADE/URETEROSCOPY/STONE EXTRACTION WITH BASKET: SHX5317

## 2015-11-05 LAB — SURGICAL PCR SCREEN
MRSA, PCR: NEGATIVE
Staphylococcus aureus: POSITIVE — AB

## 2015-11-05 SURGERY — CYSTOSCOPY, WITH CALCULUS REMOVAL USING BASKET
Anesthesia: General | Site: Ureter | Laterality: Left

## 2015-11-05 MED ORDER — MIDAZOLAM HCL 5 MG/5ML IJ SOLN
INTRAMUSCULAR | Status: DC | PRN
  Administered 2015-11-05: 2 mg via INTRAVENOUS

## 2015-11-05 MED ORDER — FENTANYL CITRATE (PF) 100 MCG/2ML IJ SOLN: 25.0000 ug | INTRAMUSCULAR | Status: DC | PRN

## 2015-11-05 MED ORDER — IOHEXOL 300 MG/ML  SOLN
INTRAMUSCULAR | Status: DC | PRN
  Administered 2015-11-05: 10 mL

## 2015-11-05 MED ORDER — 0.9 % SODIUM CHLORIDE (POUR BTL) OPTIME
TOPICAL | Status: DC | PRN
  Administered 2015-11-05: 1000 mL

## 2015-11-05 MED ORDER — ONDANSETRON HCL 4 MG/2ML IJ SOLN
INTRAMUSCULAR | Status: DC | PRN
  Administered 2015-11-05: 4 mg via INTRAVENOUS

## 2015-11-05 MED ORDER — LACTATED RINGERS IV SOLN: INTRAVENOUS | Status: DC

## 2015-11-05 MED ORDER — OXYCODONE-ACETAMINOPHEN 5-325 MG PO TABS: 1.0000 | ORAL_TABLET | Freq: Three times a day (TID) | ORAL | Status: DC | PRN

## 2015-11-05 MED ORDER — SODIUM CHLORIDE 0.9 % IR SOLN
Status: DC | PRN
  Administered 2015-11-05: 3000 mL
  Administered 2015-11-05: 1000 mL

## 2015-11-05 MED ORDER — CEFAZOLIN SODIUM-DEXTROSE 2-4 GM/100ML-% IV SOLN
2.0000 g | INTRAVENOUS | Status: AC
  Administered 2015-11-05: 2 g via INTRAVENOUS
  Filled 2015-11-05: qty 100

## 2015-11-05 MED ORDER — DEXAMETHASONE SODIUM PHOSPHATE 10 MG/ML IJ SOLN
INTRAMUSCULAR | Status: DC | PRN
  Administered 2015-11-05: 10 mg via INTRAVENOUS

## 2015-11-05 MED ORDER — CEPHALEXIN 500 MG PO CAPS: 500.0000 mg | ORAL_CAPSULE | Freq: Three times a day (TID) | ORAL | Status: DC

## 2015-11-05 MED ORDER — CHLORHEXIDINE GLUCONATE CLOTH 2 % EX PADS: 6.0000 | MEDICATED_PAD | Freq: Every day | CUTANEOUS | Status: DC

## 2015-11-05 MED ORDER — ONDANSETRON HCL 4 MG/2ML IJ SOLN: 4.0000 mg | Freq: Once | INTRAMUSCULAR | Status: DC | PRN

## 2015-11-05 MED ORDER — MIDAZOLAM HCL 2 MG/2ML IJ SOLN
INTRAMUSCULAR | Status: AC
  Filled 2015-11-05: qty 2

## 2015-11-05 MED ORDER — FENTANYL CITRATE (PF) 100 MCG/2ML IJ SOLN
INTRAMUSCULAR | Status: AC
  Filled 2015-11-05: qty 2

## 2015-11-05 MED ORDER — LIDOCAINE HCL (CARDIAC) 20 MG/ML IV SOLN
INTRAVENOUS | Status: AC
  Filled 2015-11-05: qty 5

## 2015-11-05 MED ORDER — ONDANSETRON HCL 4 MG/2ML IJ SOLN
INTRAMUSCULAR | Status: AC
  Filled 2015-11-05: qty 2

## 2015-11-05 MED ORDER — MUPIROCIN 2 % EX OINT
1.0000 "application " | TOPICAL_OINTMENT | Freq: Two times a day (BID) | CUTANEOUS | Status: DC
  Administered 2015-11-05: 1 via NASAL
  Filled 2015-11-05: qty 22

## 2015-11-05 MED ORDER — HYDROMORPHONE HCL 1 MG/ML IJ SOLN
1.0000 mg | INTRAMUSCULAR | Status: DC | PRN
  Administered 2015-11-05 (×2): 1 mg via INTRAVENOUS
  Filled 2015-11-05 (×2): qty 1

## 2015-11-05 MED ORDER — LACTATED RINGERS IV SOLN
INTRAVENOUS | Status: DC | PRN
  Administered 2015-11-05: 19:00:00 via INTRAVENOUS

## 2015-11-05 MED ORDER — CEFAZOLIN SODIUM-DEXTROSE 2-3 GM-% IV SOLR
INTRAVENOUS | Status: AC
  Filled 2015-11-05: qty 50

## 2015-11-05 MED ORDER — LIDOCAINE HCL (CARDIAC) 20 MG/ML IV SOLN
INTRAVENOUS | Status: DC | PRN
  Administered 2015-11-05: 100 mg via INTRAVENOUS

## 2015-11-05 MED ORDER — FENTANYL CITRATE (PF) 100 MCG/2ML IJ SOLN
INTRAMUSCULAR | Status: DC | PRN
  Administered 2015-11-05 (×2): 50 ug via INTRAVENOUS

## 2015-11-05 MED ORDER — PROPOFOL 10 MG/ML IV BOLUS
INTRAVENOUS | Status: AC
  Filled 2015-11-05: qty 20

## 2015-11-05 MED ORDER — PROPOFOL 10 MG/ML IV BOLUS
INTRAVENOUS | Status: DC | PRN
  Administered 2015-11-05: 200 mg via INTRAVENOUS

## 2015-11-05 MED ORDER — ONDANSETRON HCL 4 MG/2ML IJ SOLN: 4.0000 mg | Freq: Three times a day (TID) | INTRAMUSCULAR | Status: DC | PRN

## 2015-11-05 SURGICAL SUPPLY — 20 items
BAG URO CATCHER STRL LF (MISCELLANEOUS) ×3 IMPLANT
BASKET ZERO TIP NITINOL 2.4FR (BASKET) ×3 IMPLANT
BSKT STON RTRVL ZERO TP 2.4FR (BASKET) ×1
CATH INTERMIT  6FR 70CM (CATHETERS) ×3 IMPLANT
CLOTH BEACON ORANGE TIMEOUT ST (SAFETY) ×3 IMPLANT
FIBER LASER FLEXIVA 200 (UROLOGICAL SUPPLIES) IMPLANT
FIBER LASER FLEXIVA 365 (UROLOGICAL SUPPLIES) ×3 IMPLANT
FIBER LASER TRAC TIP (UROLOGICAL SUPPLIES) IMPLANT
GLOVE BIOGEL M STRL SZ7.5 (GLOVE) ×3 IMPLANT
GOWN STRL REUS W/TWL LRG LVL3 (GOWN DISPOSABLE) ×6 IMPLANT
GUIDEWIRE ANG ZIPWIRE 038X150 (WIRE) IMPLANT
GUIDEWIRE STR DUAL SENSOR (WIRE) ×3 IMPLANT
IV NS 1000ML (IV SOLUTION) ×3
IV NS 1000ML BAXH (IV SOLUTION) ×1 IMPLANT
MANIFOLD NEPTUNE II (INSTRUMENTS) ×3 IMPLANT
PACK CYSTO (CUSTOM PROCEDURE TRAY) ×3 IMPLANT
SHEATH ACCESS URETERAL 38CM (SHEATH) IMPLANT
STENT URET 6FRX26 CONTOUR (STENTS) ×3 IMPLANT
TUBING CONNECTING 10 (TUBING) ×2 IMPLANT
TUBING CONNECTING 10' (TUBING) ×1

## 2015-11-05 NOTE — Progress Notes (Signed)
  Patient discharged with husband, no pain, vitals stable, voided 500 cc pink color urine, prescriptions given, discharge summary explained and verbalized understanding and signed. PIV dc, transport to exit door via wheelchair.

## 2015-11-05 NOTE — Anesthesia Postprocedure Evaluation (Signed)
Anesthesia Post Note  Patient: Ana Murphy  Procedure(s) Performed: Procedure(s) (LRB): CYSTOSCOPY/RETROGRADE/LEFT URETEROSCOPY WITH LASER LITHOTRIPSY/STONE BASKETRY AND LEFT URETERAL STENT (Left)  Patient location during evaluation: PACU Anesthesia Type: General Level of consciousness: awake and alert Pain management: pain level controlled Vital Signs Assessment: post-procedure vital signs reviewed and stable Respiratory status: spontaneous breathing, nonlabored ventilation, respiratory function stable and patient connected to nasal cannula oxygen Cardiovascular status: blood pressure returned to baseline and stable Postop Assessment: no signs of nausea or vomiting Anesthetic complications: no    Last Vitals:  Filed Vitals:   11/05/15 2022 11/05/15 2128  BP: 118/56 114/63  Pulse:  70  Temp: 36.9 C 37.1 C  Resp: 16 16    Last Pain:  Filed Vitals:   11/05/15 2129  PainSc: 9                  Kennieth RadFitzgerald, Kang Ishida E

## 2015-11-05 NOTE — Op Note (Signed)
Date of procedure: 11/05/2015  Preoperative diagnosis:  1. Left ureteral stone  Postoperative diagnosis:  1. Left ureteral stone   Procedure: 1. Cystoscopy 2. Left ureteroscopy 3. Laser lithotripsy 4. Stone basketing 5. Left retrograde pyelogram with interpretation 6. Left ureteral stent placement with string (6 [place] by 26 cm)  Surgeon: Baruch Gouty, MD  Anesthesia: General  Complications: None  Intraoperative findings: The patient had a distal calculus was broken into 2 small fragments on the left side. These fragments removed. Post stone removal retrograde pyelogram on the left was unremarkable. Stent was confirmed to be in the correct location.  EBL: None  Specimens: Left ureteral stone to office lab  Drains: 6 French by 26 cm double-J left ureteral stent with string attached  Disposition: Stable to the postanesthesia care unit  Indication for procedure: The patient is a 45 y.o. female with a 6 mm left ureteral stone with intractable pain and nausea and vomiting.  After reviewing the management options for treatment, the patient elected to proceed with the above surgical procedure(s). We have discussed the potential benefits and risks of the procedure, side effects of the proposed treatment, the likelihood of the patient achieving the goals of the procedure, and any potential problems that might occur during the procedure or recuperation. Informed consent has been obtained.  Description of procedure: The patient was met in the preoperative area. All risks, benefits, and indications of the procedure were described in great detail. The patient consented to the procedure. Preoperative antibiotics were given. The patient was taken to the operative theater. General anesthesia was induced per the anesthesia service. The patient was then placed in the dorsal lithotomy position and prepped and draped in the usual sterile fashion. A preoperative timeout was called. 21 French 30  cystoscope was inserted into the patient's bladder per urethra atraumatically. The left ureteral orifice was visualized and intubated with a sensor wire. The sensor wire was placed to the level of the renal pelvis under fluoroscopy. A semirigid ureteroscope was then exchanged the cystoscope. The left ureteral orifice was intubated with ureteroscope. The stone was found in the distal segment of the ureter. It was broken into fragments with laser lithotripsy. He was removed piece lysed with a stone basket. Pain ureteroscopy after stone removal showed no stone burden remaining. A retrograde pyelogram confirmed no filling defects on the left side. The ureteroscope was removed. Cystoscope was assembled with a sensor wire. A 6 French by 26 over double-J ureteral stent was placed over the sensor wire. The sensor wire was removed. The stent was confirmed in the correct location in the renal pelvis with fluoroscopy. This confirmed in the bladder direct visualization. The stent was left with a string attached. After cystoscope removal, repeat fluoroscopy the bladder confirmed the stent still closed in the patient's urinary bladder. Patient's bladder was drained. She was awoken from anesthesia and transferred in stable condition to the post anesthesia care unit.  Plan: The patient be discharged home today. She will remove her left ureteral stent by pulling on the string in 3 days. She'll follow-up in one month for stone analysis results and renal ultrasound.  Baruch Gouty, M.D.

## 2015-11-05 NOTE — Discharge Summary (Signed)
Date of admission: 11/05/2015  Date of discharge: 11/05/2015  Admission diagnosis: Left ureteral stone  Discharge diagnosis: Left ureteral stone  Secondary diagnoses:  Patient Active Problem List   Diagnosis Date Noted  . Nephrolithiasis 11/05/2015  . Sciatica 05/29/2014  . Obstructive sleep apnea 05/29/2014  . Essential hypertension, benign 08/01/2013  . Hypothyroidism, acquired, autoimmune   . Goiter   . Thyroiditis, autoimmune   . Hyperlipidemia type II   . Dyspepsia   . Fatigue   . Back pain   . Obesity 11/26/2010  . Pre-diabetes 11/26/2010    History and Physical: For full details, please see admission history and physical. Briefly, Ana Murphy is a 45 y.o. year old patient with a left ureteral stone with intractable pain and was admitted for pain control prior to surgical intervention.   Hospital Course: Patient tolerated the procedure well.  She was then transferred to the floor after an uneventful PACU stay.  She was ready for discharge home after the procedure.   Laboratory values:  No results for input(s): WBC, HGB, HCT in the last 72 hours. No results for input(s): NA, K, CL, CO2, GLUCOSE, BUN, CREATININE, CALCIUM in the last 72 hours. No results for input(s): LABPT, INR in the last 72 hours. No results for input(s): LABURIN in the last 72 hours. Results for orders placed or performed during the hospital encounter of 11/05/15  Surgical PCR screen     Status: Abnormal   Collection Time: 11/05/15 12:44 PM  Result Value Ref Range Status   MRSA, PCR NEGATIVE NEGATIVE Final   Staphylococcus aureus POSITIVE (A) NEGATIVE Final    Comment:        The Xpert SA Assay (FDA approved for NASAL specimens in patients over 62 years of age), is one component of a comprehensive surveillance program.  Test performance has been validated by [facility] for patients greater than or equal to 31 year old. It is not intended to diagnose infection nor to guide or monitor  treatment.     Disposition: Home  Discharge instruction: The patient was instructed to be ambulatory but told to refrain from heavy lifting, strenuous activity, or driving.   Discharge medications:    Medication List    TAKE these medications        cephALEXin 500 MG capsule  Commonly known as:  KEFLEX  Take 1 capsule (500 mg total) by mouth 3 (three) times daily.     metFORMIN 500 MG tablet  Commonly known as:  GLUCOPHAGE  TAKE 1 TABLET (500 MG TOTAL) BY MOUTH 2 (TWO) TIMES DAILY WITH A MEAL.     naproxen 125 MG/5ML suspension  Commonly known as:  NAPROSYN  Take by mouth 2 (two) times daily. Reported on 07/31/2015     oxyCODONE-acetaminophen 5-325 MG tablet  Commonly known as:  PERCOCET/ROXICET  Take 1 tablet by mouth every 8 (eight) hours as needed for severe pain.     predniSONE 20 MG tablet  Commonly known as:  DELTASONE  Use 3 tabs daily for 4 days, 2 tabs daily for 4 days, then 1 tab daily for 4 days.     SYNTHROID 200 MCG tablet  Generic drug:  levothyroxine  TAKE 1 TABLET (200 MCG TOTAL) BY MOUTH DAILY BEFORE BREAKFAST.     triamcinolone cream 0.1 %  Commonly known as:  KENALOG  Apply 1 application topically 2 (two) times daily. Apply sparingly to rash on leg.     VITAMIN D (CHOLECALCIFEROL) PO  Take 3,000  Units by mouth daily.        Followup:      Follow-up Information    Please follow up.   Why:  Pull string to remove ureteral stent on Thursday.      Follow up with Hildred LaserBrian James Verl Kitson, MD In 1 month.   Specialty:  Urology   Why:  with renal ultrasound on day of appointment   Contact information:   [address] [place] [id number] [phone]

## 2015-11-05 NOTE — H&P (Signed)
Chief Complaint Left 6 mm distal ureteral stone    Referring provider: Dr. Dan Floyd   History of Present Illness The patient is a 45-year-old female presenting with a left 6 mm distal ureteral stone just above the UVJ. The patient has been experiencing flank pain for 2 days now. Currently her pain is unbearable and not well-controlled with Percocet. She's had no fevers, nausea, or vomiting. However during her exam she began to vomit. She has never had a stone before.   Past Medical History Problems  1. History of esophageal reflux (Z87.19) 2. History of sleep apnea (Z86.69)  Surgical History Problems  1. History of Dilation And Curettage 2. History of Nose Surgery  Current Meds 1. MetFORMIN HCl - 500 MG Oral Tablet;  Therapy: (Recorded:27Mar2017) to Recorded 2. OxyCODONE HCl TABS;  Therapy: (Recorded:27Mar2017) to Recorded 3. Synthroid TABS;  Therapy: (Recorded:27Mar2017) to Recorded  Allergies Medication  1. Vicodin TABS  Family History Problems  1. Family history of glaucoma (Z83.511) : Mother  Social History Problems    Denied: History of Alcohol use   Caffeine use (F15.90)   Married   Never a smoker  Review of Systems Genitourinary, constitutional, skin, eye, otolaryngeal, hematologic/lymphatic, cardiovascular, pulmonary, endocrine, musculoskeletal, gastrointestinal, neurological and psychiatric system(s) were reviewed and pertinent findings if present are noted and are otherwise negative.  Gastrointestinal: nausea.  Musculoskeletal: back pain.    Vitals Vital Signs [Data Includes: Last 1 Day]  Recorded: 27Mar2017 10:18AM  Height: 5 ft 7 in Weight: 254 lb 8.66 oz BMI Calculated: 39.87 BSA Calculated: 2.24 Blood Pressure: 123 / 72 Temperature: 97.2 F Heart Rate: 92  Physical Exam Constitutional: Well nourished . Patient crying during exam.  ENT:. The ears and nose are normal in appearance.  Neck: The appearance of the neck is normal.  Pulmonary:  No respiratory distress.  Cardiovascular:. No peripheral edema.  Abdomen: The abdomen is soft and nontender. moderate left CVA tenderness.  Skin: Normal skin turgor and no visible rash.  Neuro/Psych:. Mood and affect are appropriate. No focal sensory deficits.    Results/Data Urine [Data Includes: Last 1 Day]   27Mar2017  COLOR YELLOW   APPEARANCE CLEAR   SPECIFIC GRAVITY 1.015   pH 6.0   GLUCOSE NEGATIVE   BILIRUBIN NEGATIVE   KETONE NEGATIVE   BLOOD TRACE   PROTEIN NEGATIVE   NITRITE NEGATIVE   LEUKOCYTE ESTERASE NEGATIVE   SQUAMOUS EPITHELIAL/HPF 0-5 HPF  WBC 0-5 WBC/HPF  RBC 0-2 RBC/HPF  BACTERIA NONE SEEN HPF  CRYSTALS NONE SEEN HPF  CASTS NONE SEEN LPF  Yeast NONE SEEN HPF   CLINICAL DATA: Left flank pain radiating to left lower  quadrant/groin, dysuria     EXAM:  CT ABDOMEN AND PELVIS WITHOUT CONTRAST     TECHNIQUE:  Multidetector CT imaging of the abdomen and pelvis was performed  following the standard protocol without IV contrast.     COMPARISON: None.     FINDINGS:  Lower chest: Lung bases are clear.     Hepatobiliary: Mild hepatic steatosis.     Gallbladder is notable for layering sludge (series 2/ image 26). No  associated inflammatory changes.     Pancreas: Within normal limits.     Spleen: Within normal limits.     Adrenals/Urinary Tract: Adrenal glands within normal limits.     Right kidney is within normal limits.     Mild left perirenal edema with nonspecific stranding. Mild left  hydroureteronephrosis.     6 mm distal left ureteral   calculus just above the UVJ (series  2/image 76).     Bladder is underdistended but unremarkable.     Stomach/Bowel: Stomach is within normal limits.     No evidence of bowel obstruction.     Normal appendix (series 2/image 59).     Vascular/Lymphatic: No evidence of abdominal aortic aneurysm.     No suspicious abdominopelvic lymphadenopathy.      Reproductive: Uterus is within normal limits.     Bilateral ovaries within normal limits     Other: No abdominopelvic ascites.     Musculoskeletal: Grade 1 spondylolisthesis at L4-5.     IMPRESSION:  6 mm distal left ureteral calculus just above the UVJ. Mild left  hydroureteronephrosis.     Mild hepatic steatosis.   Plan Health Maintenance  1. UA With REFLEX; [Do Not Release]; Status:Complete;   Done: 27Mar2017 09:54AM  1. Left distal 6 mm UVJ stone  The patient has failed medical expulsive therapy due to poorly controlled pain and nausea and vomiting. I discussed with her the risks, benefits, indications of cystoscopy, left ureteroscopy, laser lithotripsy and left ureteral stent. She understands that this may take more than one procedure. We will transfer to the hospital now in anticipation of performing surgery later today. She was also given a shot of Phenergan 25 mg IM and Toradol 30 mg IM.   Discussion/Summary CC: Dr. Dan Floyd, Dr. K. Rice   Signatures Electronically signed by : Albin Duckett, M.D.; Nov 05 2015 10:44AM EST   

## 2015-11-05 NOTE — Transfer of Care (Signed)
Immediate Anesthesia Transfer of Care Note  Patient: Ana Murphy  Procedure(s) Performed: Procedure(s): CYSTOSCOPY/RETROGRADE/LEFT URETEROSCOPY WITH LASER LITHOTRIPSY/STONE BASKETRY AND LEFT URETERAL STENT (Left)  Patient Location: PACU  Anesthesia Type:General  Level of Consciousness:  sedated, patient cooperative and responds to stimulation  Airway & Oxygen Therapy:Patient Spontanous Breathing and Patient connected to face mask oxgen  Post-op Assessment:  Report given to PACU RN and Post -op Vital signs reviewed and stable  Post vital signs:  Reviewed and stable  Last Vitals:  Filed Vitals:   11/05/15 1201  BP: 134/78  Pulse: 88  Temp: 37 C  Resp: 16    Complications: No apparent anesthesia complications

## 2015-11-05 NOTE — Anesthesia Preprocedure Evaluation (Addendum)
Anesthesia Evaluation  Patient identified by MRN, date of birth, ID band Patient awake    Reviewed: Allergy & Precautions, NPO status , Patient's Chart, lab work & pertinent test results  Airway Mallampati: II  TM Distance: >3 FB Neck ROM: Full    Dental   Pulmonary sleep apnea ,    breath sounds clear to auscultation       Cardiovascular negative cardio ROS   Rhythm:Regular Rate:Normal     Neuro/Psych negative neurological ROS     GI/Hepatic negative GI ROS, Neg liver ROS,   Endo/Other  Hypothyroidism Morbid obesity  Renal/GU Renal disease     Musculoskeletal   Abdominal   Peds  Hematology negative hematology ROS (+)   Anesthesia Other Findings   Reproductive/Obstetrics                            Lab Results  Component Value Date   WBC 6.2 06/01/2014   HGB 15.8* 06/01/2014   HCT 47.3* 06/01/2014   MCV 86.8 06/01/2014   PLT 228 06/01/2014   Lab Results  Component Value Date   CREATININE 0.92 06/01/2014   BUN 16 06/01/2014   NA 138 06/01/2014   K 4.9 06/01/2014   CL 104 06/01/2014   CO2 27 06/01/2014    Anesthesia Physical Anesthesia Plan  ASA: III  Anesthesia Plan: General   Post-op Pain Management:    Induction: Intravenous  Airway Management Planned: LMA  Additional Equipment:   Intra-op Plan:   Post-operative Plan: Extubation in OR  Informed Consent: I have reviewed the patients History and Physical, chart, labs and discussed the procedure including the risks, benefits and alternatives for the proposed anesthesia with the patient or authorized representative who has indicated his/her understanding and acceptance.   Dental advisory given  Plan Discussed with: CRNA  Anesthesia Plan Comments:         Anesthesia Quick Evaluation

## 2015-11-05 NOTE — Anesthesia Procedure Notes (Signed)
Procedure Name: LMA Insertion Date/Time: 11/05/2015 7:12 PM Performed by: Epimenio SarinJARVELA, Cybil [place] R Pre-anesthesia Checklist: Patient identified, Emergency Drugs available, Suction available, Patient being monitored and Timeout performed Patient Re-evaluated:Patient Re-evaluated prior to inductionOxygen Delivery Method: Circle system utilized Preoxygenation: Pre-oxygenation with 100% oxygen Intubation Type: IV induction Ventilation: Mask ventilation without difficulty LMA: LMA with gastric port inserted LMA Size: 4.0 Number of attempts: 1 Dental Injury: Teeth and Oropharynx as per pre-operative assessment

## 2015-11-06 ENCOUNTER — Encounter (HOSPITAL_COMMUNITY): Payer: Self-pay | Admitting: Urology

## 2015-11-06 ENCOUNTER — Emergency Department (HOSPITAL_COMMUNITY)
Admission: EM | Admit: 2015-11-06 | Discharge: 2015-11-07 | Disposition: A | Discharge status: Home / Self Care | Payer: BLUE CROSS/BLUE SHIELD | Attending: Emergency Medicine | Admitting: Emergency Medicine

## 2015-11-06 DIAGNOSIS — E039 Hypothyroidism, unspecified: Secondary | ICD-10-CM | POA: Insufficient documentation

## 2015-11-06 DIAGNOSIS — N23 Unspecified renal colic: Secondary | ICD-10-CM

## 2015-11-06 DIAGNOSIS — Z792 Long term (current) use of antibiotics: Secondary | ICD-10-CM | POA: Diagnosis not present

## 2015-11-06 DIAGNOSIS — R109 Unspecified abdominal pain: Secondary | ICD-10-CM | POA: Diagnosis present

## 2015-11-06 DIAGNOSIS — Z7984 Long term (current) use of oral hypoglycemic drugs: Secondary | ICD-10-CM | POA: Diagnosis not present

## 2015-11-06 DIAGNOSIS — E669 Obesity, unspecified: Secondary | ICD-10-CM | POA: Insufficient documentation

## 2015-11-06 NOTE — ED Notes (Signed)
Pt states that she was seen yesterday and had lithotripsy and had a stent placed; pt states that the stent began leaking through the night and was seen at the Urologist office this am; pt states that she was seen at the Urologist office today and that the stent was removed and she was given some pain medication; pt states that she has had pain throughout the day and just dribbling with urination; pt states that she tool Oxycodone at 6:15 and Ibuprofen PTA without relief of pain; pt states that she is unable to stay at home in this much pain

## 2015-11-07 ENCOUNTER — Encounter (HOSPITAL_COMMUNITY): Payer: Self-pay | Admitting: *Deleted

## 2015-11-07 LAB — URINALYSIS, ROUTINE W REFLEX MICROSCOPIC
BILIRUBIN URINE: NEGATIVE
Glucose, UA: NEGATIVE mg/dL
Ketones, ur: NEGATIVE mg/dL
NITRITE: NEGATIVE
Protein, ur: NEGATIVE mg/dL
SPECIFIC GRAVITY, URINE: 1.025 (ref 1.005–1.030)
pH: 6 (ref 5.0–8.0)

## 2015-11-07 LAB — URINE MICROSCOPIC-ADD ON

## 2015-11-07 MED ORDER — KETOROLAC TROMETHAMINE 30 MG/ML IJ SOLN
30.0000 mg | Freq: Once | INTRAMUSCULAR | Status: AC
  Administered 2015-11-07: 30 mg via INTRAVENOUS
  Filled 2015-11-07: qty 1

## 2015-11-07 MED ORDER — HYDROMORPHONE HCL 1 MG/ML IJ SOLN
1.0000 mg | Freq: Once | INTRAMUSCULAR | Status: AC
  Administered 2015-11-07: 1 mg via INTRAVENOUS
  Filled 2015-11-07: qty 1

## 2015-11-07 MED ORDER — HYDROMORPHONE HCL 1 MG/ML IJ SOLN
0.5000 mg | Freq: Once | INTRAMUSCULAR | Status: AC
  Administered 2015-11-07: 0.5 mg via INTRAVENOUS
  Filled 2015-11-07: qty 1

## 2015-11-07 MED ORDER — ONDANSETRON 4 MG PO TBDP: 4.0000 mg | ORAL_TABLET | Freq: Three times a day (TID) | ORAL | Status: DC | PRN

## 2015-11-07 MED ORDER — ONDANSETRON HCL 4 MG/2ML IJ SOLN
4.0000 mg | Freq: Once | INTRAMUSCULAR | Status: AC
  Administered 2015-11-07: 4 mg via INTRAVENOUS
  Filled 2015-11-07: qty 2

## 2015-11-07 NOTE — ED Notes (Signed)
PA at bedside.

## 2015-11-07 NOTE — ED Provider Notes (Signed)
CSN: [id number]     Arrival date & time 11/06/15  2348 History   First MD Initiated Contact with Patient 11/07/15 0104     Chief Complaint  Patient presents with  . Flank Pain     (Consider location/radiation/quality/duration/timing/severity/associated sxs/prior Treatment) HPI Comments: The patient returns to the ED with recurrent/persistent left flank pain. She was treated by Dr. Jhonnie Garner (urology) for 6 mm left ureteral stone with laser lithotripsy and ureteral stent. She reports the stent that was placed was removed this morning as there was a complication with the stent placement. She was told that the pain would improve but she reports the pain has increased throughout today. No fever. She was placed on Keflex at the time of discharge (yesterday) but reports she did not take all she was supposed to take. No fever. She has been vomiting.   Patient is a 45 y.o. female presenting with flank pain. The history is provided by the patient. No language interpreter was used.  Flank Pain Associated symptoms include abdominal pain, nausea and vomiting. Pertinent negatives include no chills or fever.    Past Medical History  Diagnosis Date  . Hypothyroidism, acquired, autoimmune   . Goiter   . Thyroiditis, autoimmune   . Obesity   . Pre-diabetes   . Hyperlipidemia type II   . Dyspepsia   . Fatigue   . Back pain    Past Surgical History  Procedure Laterality Date  . Nose surgery    . Dilation and curettage of uterus    . Cystoscopy/retrograde/ureteroscopy/stone extraction with basket Left 11/05/2015    Procedure: CYSTOSCOPY/RETROGRADE/LEFT URETEROSCOPY WITH LASER LITHOTRIPSY/STONE BASKETRY AND LEFT URETERAL STENT;  Surgeon: Hildred Laser, MD;  Location: [facility];  Service: Urology;  Laterality: Left;   Family History  Problem Relation Age of Onset  . Hypothyroidism Mother   . Hypothyroidism Brother   . Heart disease Maternal Grandfather    Social History  Substance Use Topics  .  Smoking status: Never Smoker   . Smokeless tobacco: Never Used  . Alcohol Use: No   OB History    No data available     Review of Systems  Constitutional: Negative for fever and chills.  Respiratory: Negative.   Cardiovascular: Negative.   Gastrointestinal: Positive for nausea, vomiting and abdominal pain.  Genitourinary: Positive for flank pain.  Musculoskeletal: Negative.   Neurological: Negative.       Allergies  Vicodin  Home Medications   Prior to Admission medications   Medication Sig Start Date End Date Taking? Authorizing Provider  cephALEXin (KEFLEX) 500 MG capsule Take 1 capsule (500 mg total) by mouth 3 (three) times daily. 11/05/15  Yes Hildred Laser, MD  metFORMIN (GLUCOPHAGE) 500 MG tablet TAKE 1 TABLET (500 MG TOTAL) BY MOUTH 2 (TWO) TIMES DAILY WITH A MEAL. Patient taking differently: Take 500 mg by mouth twice daily with a meal. 06/05/15  Yes David Stall, MD  oxyCODONE-acetaminophen (PERCOCET/ROXICET) 5-325 MG tablet Take 1 tablet by mouth every 8 (eight) hours as needed for severe pain. 11/05/15  Yes Hildred Laser, MD  SYNTHROID 200 MCG tablet TAKE 1 TABLET (200 MCG TOTAL) BY MOUTH DAILY BEFORE BREAKFAST. Patient taking differently: Take 200 mcg once daily before breakfast. 12/29/14  Yes David Stall, MD  VITAMIN D, CHOLECALCIFEROL, PO Take 3,000 Units by mouth daily.    Yes Historical Provider, MD   BP 127/87 mmHg  Pulse 106  Temp(Src) 98.3 F (36.8 C) (Oral)  Resp 16  Ht 5\' 7"  (1.702 m)  Wt 115.667 kg  BMI 39.93 kg/m2  SpO2 99%  LMP 10/19/2015 Physical Exam  Constitutional: She is oriented to person, place, and time. She appears well-developed and well-nourished.  Uncomfortable appearing.  Neck: Normal range of motion.  Pulmonary/Chest: Effort normal.  Abdominal: Soft. Bowel sounds are normal. She exhibits no distension.  LLQ tenderness that is mild. No guarding.  Genitourinary:  Left CVA tenderness.   Neurological: She is  alert and oriented to person, place, and time.  Skin: Skin is warm and dry.    ED Course  Procedures (including critical care time) Labs Review Labs Reviewed  URINALYSIS, ROUTINE W REFLEX MICROSCOPIC (NOT AT [facility])   Results for orders placed or performed during the hospital encounter of 11/06/15  Urinalysis, Routine w reflex microscopic  Result Value Ref Range   Color, Urine YELLOW YELLOW   APPearance CLOUDY (A) CLEAR   Specific Gravity, Urine 1.025 1.005 - 1.030   pH 6.0 5.0 - 8.0   Glucose, UA NEGATIVE NEGATIVE mg/dL   Hgb urine dipstick MODERATE (A) NEGATIVE   Bilirubin Urine NEGATIVE NEGATIVE   Ketones, ur NEGATIVE NEGATIVE mg/dL   Protein, ur NEGATIVE NEGATIVE mg/dL   Nitrite NEGATIVE NEGATIVE   Leukocytes, UA MODERATE (A) NEGATIVE  Urine microscopic-add on  Result Value Ref Range   Squamous Epithelial / LPF 6-30 (A) NONE SEEN   WBC, UA 6-30 0 - 5 WBC/hpf   RBC / HPF TOO NUMEROUS TO COUNT 0 - 5 RBC/hpf   Bacteria, UA MANY (A) NONE SEEN     Imaging Review No results found. I have personally reviewed and evaluated these images and lab results as part of my medical decision-making.   EKG Interpretation None      MDM   Final diagnoses:  None    1. Ureteral colic  The patient presents with persistent/recurrent left flank pain and vomiting after lithotripsy performed yesterday. No fever. Medication for pain at home not helping.  1:30- pain medication provided IV (dilaudid 1mg ) and Zofran 4 mg. Nausea resolved. Pain went from a "10" to a "5".  3:00 - she continues to have pain that is not improving. Additional Dilaudid provided. UA obtained.  4:30 - Toradol, Dilaudid ordered. She appears more comfortable. UA negative for infection. VSS. Discussed with Dr. Blinda LeatherwoodPollina. Anticipate discharge home after pain control achieved.  5:30 - pain is controlled with Toradol and final dose Dilaudid. She is ready for discharge home and will call Dr. Sherryl BartersBudzyn today for recheck.    Elpidio AnisShari Waldron Gerry, PA-C 11/07/15 0544  Gilda Creasehristopher J Pollina, MD 11/07/15 [phone]

## 2015-11-07 NOTE — Discharge Instructions (Signed)
Renal Colic  Renal colic is pain that is caused by passing a kidney stone. The pain can be sharp and severe. It may be felt in the back, abdomen, side (flank), or groin. It can cause nausea. Renal colic can come and go.  HOME CARE INSTRUCTIONS  Watch your condition for any changes. The following actions may help to lessen any discomfort that you are feeling:  · Take medicines only as directed by your health care provider.  · Ask your health care provider if it is okay to take over-the-counter pain medicine.  · Drink enough fluid to keep your urine clear or pale yellow. Drink 6-8 glasses of water each day.  · Limit the amount of salt that you eat to less than 2 grams per day.  · Reduce the amount of protein in your diet. Eat less meat, fish, nuts, and dairy.  · Avoid foods such as spinach, rhubarb, nuts, or bran. These may make kidney stones more likely to form.  SEEK MEDICAL CARE IF:  · You have a fever or chills.  · Your urine smells bad or looks cloudy.  · You have pain or burning when you pass urine.  SEEK IMMEDIATE MEDICAL CARE IF:  · Your flank pain or groin pain suddenly worsens.  · You become confused or disoriented or you lose consciousness.     This information is not intended to replace advice given to you by your health care provider. Make sure you discuss any questions you have with your health care provider.     Document Released: 05/07/2005 Document Revised: 08/18/2014 Document Reviewed: 06/07/2014  Elsevier Interactive Patient Education ©2016 [employer]

## 2015-11-08 ENCOUNTER — Ambulatory Visit (HOSPITAL_COMMUNITY): Payer: BLUE CROSS/BLUE SHIELD | Admitting: Anesthesiology

## 2015-11-08 ENCOUNTER — Encounter (HOSPITAL_COMMUNITY)
Admission: AD | Disposition: A | Discharge status: Home / Self Care | Payer: Self-pay | Source: Ambulatory Visit | Attending: Urology

## 2015-11-08 ENCOUNTER — Ambulatory Visit (HOSPITAL_COMMUNITY)
Admission: AD | Admit: 2015-11-08 | Discharge: 2015-11-08 | Disposition: A | Discharge status: Home / Self Care | Payer: BLUE CROSS/BLUE SHIELD | Source: Ambulatory Visit | Attending: Urology | Admitting: Urology

## 2015-11-08 ENCOUNTER — Other Ambulatory Visit: Payer: Self-pay | Admitting: Urology

## 2015-11-08 ENCOUNTER — Encounter (HOSPITAL_COMMUNITY): Payer: Self-pay | Admitting: *Deleted

## 2015-11-08 DIAGNOSIS — G4730 Sleep apnea, unspecified: Secondary | ICD-10-CM | POA: Insufficient documentation

## 2015-11-08 DIAGNOSIS — Z6839 Body mass index (BMI) 39.0-39.9, adult: Secondary | ICD-10-CM | POA: Diagnosis not present

## 2015-11-08 DIAGNOSIS — N132 Hydronephrosis with renal and ureteral calculous obstruction: Secondary | ICD-10-CM | POA: Insufficient documentation

## 2015-11-08 DIAGNOSIS — E119 Type 2 diabetes mellitus without complications: Secondary | ICD-10-CM | POA: Diagnosis not present

## 2015-11-08 DIAGNOSIS — K760 Fatty (change of) liver, not elsewhere classified: Secondary | ICD-10-CM | POA: Diagnosis not present

## 2015-11-08 DIAGNOSIS — I10 Essential (primary) hypertension: Secondary | ICD-10-CM | POA: Diagnosis not present

## 2015-11-08 DIAGNOSIS — Z7984 Long term (current) use of oral hypoglycemic drugs: Secondary | ICD-10-CM | POA: Diagnosis not present

## 2015-11-08 DIAGNOSIS — K219 Gastro-esophageal reflux disease without esophagitis: Secondary | ICD-10-CM | POA: Diagnosis not present

## 2015-11-08 DIAGNOSIS — Z79899 Other long term (current) drug therapy: Secondary | ICD-10-CM | POA: Diagnosis not present

## 2015-11-08 HISTORY — PX: CYSTOSCOPY WITH RETROGRADE PYELOGRAM, URETEROSCOPY AND STENT PLACEMENT: SHX5789

## 2015-11-08 LAB — GLUCOSE, CAPILLARY
Glucose-Capillary: 78 mg/dL (ref 65–99)
Glucose-Capillary: 79 mg/dL (ref 65–99)
Glucose-Capillary: 79 mg/dL (ref 65–99)

## 2015-11-08 LAB — BASIC METABOLIC PANEL
ANION GAP: 10 (ref 5–15)
BUN: 13 mg/dL (ref 6–20)
CALCIUM: 9.1 mg/dL (ref 8.9–10.3)
CHLORIDE: 106 mmol/L (ref 101–111)
CO2: 24 mmol/L (ref 22–32)
CREATININE: 0.93 mg/dL (ref 0.44–1.00)
GFR calc non Af Amer: >60 mL/min (ref >60)
Glucose, Bld: 89 mg/dL (ref 65–99)
Potassium: 3.6 mmol/L (ref 3.5–5.1)
SODIUM: 140 mmol/L (ref 135–145)

## 2015-11-08 LAB — HEMOGLOBIN: Hemoglobin: 13.7 g/dL (ref 12.0–15.0)

## 2015-11-08 SURGERY — CYSTOURETEROSCOPY, WITH RETROGRADE PYELOGRAM AND STENT INSERTION
Anesthesia: General | Site: Urethra | Laterality: Left

## 2015-11-08 MED ORDER — PROPOFOL 10 MG/ML IV BOLUS
INTRAVENOUS | Status: DC | PRN
  Administered 2015-11-08: 200 mg via INTRAVENOUS

## 2015-11-08 MED ORDER — FENTANYL CITRATE (PF) 100 MCG/2ML IJ SOLN
INTRAMUSCULAR | Status: DC | PRN
  Administered 2015-11-08 (×2): 50 ug via INTRAVENOUS

## 2015-11-08 MED ORDER — ONDANSETRON HCL 4 MG/2ML IJ SOLN
INTRAMUSCULAR | Status: AC
  Filled 2015-11-08: qty 2

## 2015-11-08 MED ORDER — MIDAZOLAM HCL 5 MG/5ML IJ SOLN
INTRAMUSCULAR | Status: DC | PRN
  Administered 2015-11-08: 2 mg via INTRAVENOUS

## 2015-11-08 MED ORDER — OXYCODONE-ACETAMINOPHEN 5-325 MG PO TABS: 1.0000 | ORAL_TABLET | Freq: Four times a day (QID) | ORAL | Status: DC | PRN

## 2015-11-08 MED ORDER — MIDAZOLAM HCL 2 MG/2ML IJ SOLN: 0.5000 mg | Freq: Once | INTRAMUSCULAR | Status: DC | PRN

## 2015-11-08 MED ORDER — ACETAMINOPHEN 10 MG/ML IV SOLN: 1000.0000 mg | Freq: Once | INTRAVENOUS | Status: DC

## 2015-11-08 MED ORDER — MEPERIDINE HCL 50 MG/ML IJ SOLN: 6.2500 mg | INTRAMUSCULAR | Status: DC | PRN

## 2015-11-08 MED ORDER — PROPOFOL 10 MG/ML IV BOLUS
INTRAVENOUS | Status: AC
  Filled 2015-11-08: qty 20

## 2015-11-08 MED ORDER — DEXAMETHASONE SODIUM PHOSPHATE 10 MG/ML IJ SOLN
INTRAMUSCULAR | Status: AC
  Filled 2015-11-08: qty 1

## 2015-11-08 MED ORDER — ONDANSETRON HCL 4 MG/2ML IJ SOLN
INTRAMUSCULAR | Status: DC | PRN
Start: 2015-11-08 — End: 2015-11-08
  Administered 2015-11-08: 4 mg via INTRAVENOUS

## 2015-11-08 MED ORDER — LACTATED RINGERS IV SOLN
INTRAVENOUS | Status: DC
  Administered 2015-11-08: 1000 mL via INTRAVENOUS

## 2015-11-08 MED ORDER — KETOROLAC TROMETHAMINE 30 MG/ML IJ SOLN
INTRAMUSCULAR | Status: AC
  Filled 2015-11-08: qty 1

## 2015-11-08 MED ORDER — LIDOCAINE HCL (CARDIAC) 20 MG/ML IV SOLN
INTRAVENOUS | Status: AC
  Filled 2015-11-08: qty 5

## 2015-11-08 MED ORDER — DEXTROSE 5 % IV SOLN
INTRAVENOUS | Status: AC
  Filled 2015-11-08: qty 2

## 2015-11-08 MED ORDER — ACETAMINOPHEN 10 MG/ML IV SOLN
INTRAVENOUS | Status: AC
  Filled 2015-11-08: qty 100

## 2015-11-08 MED ORDER — IOPAMIDOL (ISOVUE-300) INJECTION 61%
INTRAVENOUS | Status: DC | PRN
  Administered 2015-11-08: 5 mL via URETHRAL

## 2015-11-08 MED ORDER — KETOROLAC TROMETHAMINE 30 MG/ML IJ SOLN
30.0000 mg | Freq: Once | INTRAMUSCULAR | Status: AC
  Administered 2015-11-08: 30 mg via INTRAVENOUS

## 2015-11-08 MED ORDER — FENTANYL CITRATE (PF) 100 MCG/2ML IJ SOLN
INTRAMUSCULAR | Status: AC
  Filled 2015-11-08: qty 2

## 2015-11-08 MED ORDER — ACETAMINOPHEN 10 MG/ML IV SOLN
1000.0000 mg | Freq: Once | INTRAVENOUS | Status: AC
  Administered 2015-11-08: 1000 mg via INTRAVENOUS

## 2015-11-08 MED ORDER — DEXTROSE 5 % IV SOLN
2.0000 g | INTRAVENOUS | Status: AC
  Administered 2015-11-08: 2 g via INTRAVENOUS

## 2015-11-08 MED ORDER — KETOROLAC TROMETHAMINE 30 MG/ML IJ SOLN: 30.0000 mg | Freq: Once | INTRAMUSCULAR | Status: DC

## 2015-11-08 MED ORDER — MIDAZOLAM HCL 2 MG/2ML IJ SOLN
INTRAMUSCULAR | Status: AC
  Filled 2015-11-08: qty 2

## 2015-11-08 MED ORDER — DEXAMETHASONE SODIUM PHOSPHATE 10 MG/ML IJ SOLN
INTRAMUSCULAR | Status: DC | PRN
  Administered 2015-11-08: 10 mg via INTRAVENOUS

## 2015-11-08 MED ORDER — LIDOCAINE HCL (CARDIAC) 20 MG/ML IV SOLN
INTRAVENOUS | Status: DC | PRN
  Administered 2015-11-08: 100 mg via INTRAVENOUS

## 2015-11-08 MED ORDER — HYDROMORPHONE HCL 1 MG/ML IJ SOLN: 0.2500 mg | INTRAMUSCULAR | Status: DC | PRN

## 2015-11-08 MED ORDER — IOPAMIDOL (ISOVUE-300) INJECTION 61%
INTRAVENOUS | Status: AC
  Filled 2015-11-08: qty 50

## 2015-11-08 SURGICAL SUPPLY — 19 items
BAG URO CATCHER STRL LF (MISCELLANEOUS) ×2 IMPLANT
BASKET ZERO TIP NITINOL 2.4FR (BASKET) IMPLANT
BSKT STON RTRVL ZERO TP 2.4FR (BASKET)
CATH INTERMIT  6FR 70CM (CATHETERS) ×2 IMPLANT
CLOTH BEACON ORANGE TIMEOUT ST (SAFETY) ×2 IMPLANT
FIBER LASER FLEXIVA 1000 (UROLOGICAL SUPPLIES) IMPLANT
FIBER LASER FLEXIVA 200 (UROLOGICAL SUPPLIES) IMPLANT
FIBER LASER FLEXIVA 365 (UROLOGICAL SUPPLIES) IMPLANT
FIBER LASER FLEXIVA 550 (UROLOGICAL SUPPLIES) IMPLANT
FIBER LASER TRAC TIP (UROLOGICAL SUPPLIES) IMPLANT
GLOVE BIOGEL M 8.0 STRL (GLOVE) ×2 IMPLANT
GOWN STRL REUS W/ TWL XL LVL3 (GOWN DISPOSABLE) ×1 IMPLANT
GOWN STRL REUS W/TWL XL LVL3 (GOWN DISPOSABLE) ×4 IMPLANT
GUIDEWIRE ANG ZIPWIRE 038X150 (WIRE) IMPLANT
GUIDEWIRE STR DUAL SENSOR (WIRE) ×2 IMPLANT
MANIFOLD NEPTUNE II (INSTRUMENTS) ×2 IMPLANT
PACK CYSTO (CUSTOM PROCEDURE TRAY) ×2 IMPLANT
STENT URET 6FRX26 CONTOUR (STENTS) ×2 IMPLANT
TUBING CONNECTING 10 (TUBING) ×2 IMPLANT

## 2015-11-08 NOTE — Anesthesia Preprocedure Evaluation (Addendum)
Anesthesia Evaluation  Patient identified by MRN, date of birth, ID band Patient awake    Reviewed: Allergy & Precautions, NPO status , Patient's Chart, lab work & pertinent test results  History of Anesthesia Complications Negative for: history of anesthetic complications  Airway Mallampati: II  TM Distance: >3 FB Neck ROM: Full    Dental  (+) Dental Advisory Given, Caps   Pulmonary sleep apnea and Continuous Positive Airway Pressure Ventilation ,    breath sounds clear to auscultation       Cardiovascular hypertension (no meds ),  Rhythm:Regular Rate:Normal     Neuro/Psych negative neurological ROS     GI/Hepatic N/v with nephrolithiasis   Endo/Other  diabetes (glu 79), Oral Hypoglycemic AgentsHypothyroidism Morbid obesity  Renal/GU stones     Musculoskeletal   Abdominal (+) + obese,   Peds  Hematology negative hematology ROS (+)   Anesthesia Other Findings   Reproductive/Obstetrics 11/04/15/ preg test: neg                          Anesthesia Physical Anesthesia Plan  ASA: II  Anesthesia Plan: General   Post-op Pain Management:    Induction: Intravenous  Airway Management Planned: Oral ETT  Additional Equipment:   Intra-op Plan:   Post-operative Plan: Extubation in OR  Informed Consent: I have reviewed the patients History and Physical, chart, labs and discussed the procedure including the risks, benefits and alternatives for the proposed anesthesia with the patient or authorized representative who has indicated his/her understanding and acceptance.   Dental advisory given  Plan Discussed with: CRNA and Surgeon  Anesthesia Plan Comments: (Plan routine monitors, GETA)        Anesthesia Quick Evaluation

## 2015-11-08 NOTE — Transfer of Care (Signed)
Immediate Anesthesia Transfer of Care Note  Patient: Ana Murphy  Procedure(s) Performed: Procedure(s): CYSTOSCOPY WITH RETROGRADE PYELOGRAM, LEFT URETEROSCOPY, STONE EXTRACTION AND LEFT STENT PLACEMENT (Left)  Patient Location: PACU  Anesthesia Type:General  Level of Consciousness: awake, alert , oriented and patient cooperative  Airway & Oxygen Therapy: Patient Spontanous Breathing and Patient connected to face mask oxygen  Post-op Assessment: Report given to RN, Post -op Vital signs reviewed and stable and Patient moving all extremities  Post vital signs: Reviewed and stable  Last Vitals:  Filed Vitals:   11/08/15 1412  BP: 147/78  Pulse: 86  Temp: 37.6 C  Resp: 18    Complications: No apparent anesthesia complications

## 2015-11-08 NOTE — Anesthesia Procedure Notes (Signed)
Procedure Name: Intubation Date/Time: 11/08/2015 4:07 PM Performed by: Doran ClayALDAY, Fernandez Kenley R Pre-anesthesia Checklist: Patient identified, Emergency Drugs available, Suction available, Patient being monitored and Timeout performed Patient Re-evaluated:Patient Re-evaluated prior to inductionOxygen Delivery Method: Circle system utilized Preoxygenation: Pre-oxygenation with 100% oxygen Intubation Type: IV induction, Rapid sequence and Cricoid Pressure applied Laryngoscope Size: Mac and 4 Grade View: Grade I Tube type: Oral Tube size: 7.5 mm Number of attempts: 1 Airway Equipment and Method: Stylet Placement Confirmation: ETT inserted through vocal cords under direct vision,  positive ETCO2 and breath sounds checked- equal and bilateral Secured at: 22 cm Tube secured with: Tape Dental Injury: Teeth and Oropharynx as per pre-operative assessment

## 2015-11-08 NOTE — Interval H&P Note (Signed)
History and Physical Interval Note:  11/08/2015 4:50 PM  24 hours after her stone was treated and a stent was left in place she began to experience continuous dribbling incontinence consistent with a dislodged stent. She came into the office and was having severe pain. Her pain was controlled and her stent was removed. 24 hours after that she had to go to the emergency room for continued pain. Again her pain was controlled and she then returned to the office today with continued mild pain. A renal ultrasound revealed some fullness of the left collecting system and a KUB was felt by my partner to possibly indicate the presence of a distal ureteral stone although my reading was that this was a phlebolith that was seen and in fact she did not have a stone. I discussed with the patient this fact and the fact that she's continued to have intermittent pain and she has elected to proceed with stent placement and evaluation with retrograde pyelography.  Ana Murphy  has presented today for surgery, with the diagnosis of ureteral stone  The various methods of treatment have been discussed with the patient and family. After consideration of risks, benefits and other options for treatment, the patient has consented to  Procedure(s): CYSTOSCOPY WITH RETROGRADE PYELOGRAM,  AND LEFT STENT PLACEMENT (Left) as a surgical intervention .  The patient's history has been reviewed, patient examined, no change in status, stable for surgery.  I have reviewed the patient's chart and labs.  Questions were answered to the patient's satisfaction.     Garnett FarmTTELIN,Odean Fester C

## 2015-11-08 NOTE — Anesthesia Postprocedure Evaluation (Signed)
Anesthesia Post Note  Patient: Ana Murphy  Procedure(s) Performed: Procedure(s) (LRB): CYSTOSCOPY WITH RETROGRADE PYELOGRAM,  AND LEFT STENT PLACEMENT (Left)  Patient location during evaluation: PACU Anesthesia Type: General Level of consciousness: awake and alert, oriented and patient cooperative Pain management: pain level controlled Vital Signs Assessment: post-procedure vital signs reviewed and stable Respiratory status: spontaneous breathing, nonlabored ventilation and respiratory function stable Cardiovascular status: blood pressure returned to baseline and stable Postop Assessment: no signs of nausea or vomiting Anesthetic complications: no    Last Vitals:  Filed Vitals:   11/08/15 1715 11/08/15 1730  BP: 138/82 130/69  Pulse: 84 77  Temp: 37.3 C 37.2 C  Resp: 16 14    Last Pain:  Filed Vitals:   11/08/15 1734  PainSc: 0-No pain                 Castor Gittleman,E. Juandedios Dudash

## 2015-11-08 NOTE — H&P (View-Only) (Signed)
Chief Complaint Left 6 mm distal ureteral stone    Referring provider: Dr. Melene Plan   History of Present Illness The patient is a 45 year old female presenting with a left 6 mm distal ureteral stone just above the UVJ. The patient has been experiencing flank pain for 2 days now. Currently her pain is unbearable and not well-controlled with Percocet. She's had no fevers, nausea, or vomiting. However during her exam she began to vomit. She has never had a stone before.   Past Medical History Problems  1. History of esophageal reflux (Z87.19) 2. History of sleep apnea (Z86.69)  Surgical History Problems  1. History of Dilation And Curettage 2. History of Nose Surgery  Current Meds 1. MetFORMIN HCl - 500 MG Oral Tablet;  Therapy: (Recorded:27Mar2017) to Recorded 2. OxyCODONE HCl TABS;  Therapy: (Recorded:27Mar2017) to Recorded 3. Synthroid TABS;  Therapy: (Recorded:27Mar2017) to Recorded  Allergies Medication  1. Vicodin TABS  Family History Problems  1. Family history of glaucoma (Z83.511) : Mother  Social History Problems    Denied: History of Alcohol use   Caffeine use (F15.90)   Married   Never a smoker  Review of Systems Genitourinary, constitutional, skin, eye, otolaryngeal, hematologic/lymphatic, cardiovascular, pulmonary, endocrine, musculoskeletal, gastrointestinal, neurological and psychiatric system(s) were reviewed and pertinent findings if present are noted and are otherwise negative.  Gastrointestinal: nausea.  Musculoskeletal: back pain.    Vitals Vital Signs [Data Includes: Last 1 Day]  Recorded: 27Mar2017 10:18AM  Height: 5 ft 7 in Weight: 254 lb 8.66 oz BMI Calculated: 39.87 BSA Calculated: 2.24 Blood Pressure: 123 / 72 Temperature: 97.2 F Heart Rate: 92  Physical Exam Constitutional: Well nourished . Patient crying during exam.  ENT:. The ears and nose are normal in appearance.  Neck: The appearance of the neck is normal.  Pulmonary:  No respiratory distress.  Cardiovascular:. No peripheral edema.  Abdomen: The abdomen is soft and nontender. moderate left CVA tenderness.  Skin: Normal skin turgor and no visible rash.  Neuro/Psych:. Mood and affect are appropriate. No focal sensory deficits.    Results/Data Urine [Data Includes: Last 1 Day]   27Mar2017  COLOR YELLOW   APPEARANCE CLEAR   SPECIFIC GRAVITY 1.015   pH 6.0   GLUCOSE NEGATIVE   BILIRUBIN NEGATIVE   KETONE NEGATIVE   BLOOD TRACE   PROTEIN NEGATIVE   NITRITE NEGATIVE   LEUKOCYTE ESTERASE NEGATIVE   SQUAMOUS EPITHELIAL/HPF 0-5 HPF  WBC 0-5 WBC/HPF  RBC 0-2 RBC/HPF  BACTERIA NONE SEEN HPF  CRYSTALS NONE SEEN HPF  CASTS NONE SEEN LPF  Yeast NONE SEEN HPF   CLINICAL DATA: Left flank pain radiating to left lower  quadrant/groin, dysuria     EXAM:  CT ABDOMEN AND PELVIS WITHOUT CONTRAST     TECHNIQUE:  Multidetector CT imaging of the abdomen and pelvis was performed  following the standard protocol without IV contrast.     COMPARISON: None.     FINDINGS:  Lower chest: Lung bases are clear.     Hepatobiliary: Mild hepatic steatosis.     Gallbladder is notable for layering sludge (series 2/ image 26). No  associated inflammatory changes.     Pancreas: Within normal limits.     Spleen: Within normal limits.     Adrenals/Urinary Tract: Adrenal glands within normal limits.     Right kidney is within normal limits.     Mild left perirenal edema with nonspecific stranding. Mild left  hydroureteronephrosis.     6 mm distal left ureteral  calculus just above the UVJ (series  2/image 76).     Bladder is underdistended but unremarkable.     Stomach/Bowel: Stomach is within normal limits.     No evidence of bowel obstruction.     Normal appendix (series 2/image 59).     Vascular/Lymphatic: No evidence of abdominal aortic aneurysm.     No suspicious abdominopelvic lymphadenopathy.      Reproductive: Uterus is within normal limits.     Bilateral ovaries within normal limits     Other: No abdominopelvic ascites.     Musculoskeletal: Grade 1 spondylolisthesis at L4-5.     IMPRESSION:  6 mm distal left ureteral calculus just above the UVJ. Mild left  hydroureteronephrosis.     Mild hepatic steatosis.   Plan Health Maintenance  1. UA With REFLEX; [Do Not Release]; Status:Complete;   Done: 27Mar2017 09:54AM  1. Left distal 6 mm UVJ stone  The patient has failed medical expulsive therapy due to poorly controlled pain and nausea and vomiting. I discussed with her the risks, benefits, indications of cystoscopy, left ureteroscopy, laser lithotripsy and left ureteral stent. She understands that this may take more than one procedure. We will transfer to the hospital now in anticipation of performing surgery later today. She was also given a shot of Phenergan 25 mg IM and Toradol 30 mg IM.   Discussion/Summary CC: Dr. Melene Planan Floyd, Dr. Nelda SevereK. Rice   Signatures Electronically signed by : Hadley PenBrian Kechia Yahnke, M.D.; Nov 05 2015 10:44AM EST

## 2015-11-09 ENCOUNTER — Encounter (HOSPITAL_COMMUNITY): Payer: Self-pay | Admitting: Urology

## 2015-11-09 LAB — HEMOGLOBIN A1C
HEMOGLOBIN A1C: 5.7 % — AB (ref 4.8–5.6)
MEAN PLASMA GLUCOSE: 117 mg/dL

## 2015-11-09 NOTE — Op Note (Signed)
Post-operative Diagnosis: * No Diagnosis Codes entered *  Procedure and Anesthesia:  Procedure(s) and Anesthesia Type:    * CYSTOSCOPY WITH RETROGRADE PYELOGRAM,  AND LEFT STENT PLACEMENT - General  1. Cystourethroscopy 2. Ureteral catheterization with instillation of contrast 3. RIGHT ureteral stent placement 4. Intra-operative fluoroscopy <1 hr  Surgeon: Surgeon(s) and Role:    * Ihor GullyMark Emmogene Simson, MD - Primary   Resident:  Juliane PootPeter S Greene  EBL: minimal  IVF: See anesthesia record  UOP: See anesthesia record  Drains: 6 Fr x 26 cm JJ stent without dangler  Implants: * No implants in log *  Specimens: @ORSPECIMEN @  Complications: * No complications entered in OR log *  Indications for Surgery: 45 y.o. female with previous USE, now with possible residual fragment here for stent placement.  Operative Findings: Normal bladder, erythema around left UO.  Radiographic Findings: LEFT retrograde pyelogram demonstrated a normal ureter without stone evident and with minimal hydronephrosis with no calyceal blunting  Procedure Details: The patient and consent was verified in the pre-op holding area and brought to the operating room where they were placed on the operating table. Pre-induction time out was called and general anesthesia was induced. SCDs were placed and IV antibiotics were started.   The patient was correctly identified in the preoperative holding area where written informed consent as well potential risks and complications were reviewed. The patient was brought back to the operative suite where a preinduction timeout was performed. After correct information was verified, general anesthesia was induced via LMA.  The patient was then gently placed in dorsal lithotomy position.  Sequential compression devices were placed for VTE prophylaxis. The patient was prepped and draped in the usual sterile fashion and appropriate periprocedural antibiotics were administered. A second timeout was  performed.  We began with a 22 French rigid cystoscope.  We performed pan cystourethroscopy with findings as above. We turned our attention to the Left ureteral orifice and canulated it with an open ended catheter and performed an RPG with findings above we then passed a sensor wire, using assistance of an open ended catheter into the renal pelvis without difficulty under fluoroscopic guidance.  We then advanced a 6 [place]French 26 cm JJ ureteral stent without string and assistance of a pusher over our wire under visual and fluoroscopic guidance without difficulty.  Wire removal demonstrated satisfactory stent curl proximally in renal pelvis and distally in the bladder.  The patient's bladder was emptied and the patient was awoken from anesthesia and taken to the recovery area.   I was present and participated in all of this procedure/operation.

## 2015-12-21 ENCOUNTER — Other Ambulatory Visit: Payer: Self-pay | Admitting: *Deleted

## 2015-12-21 DIAGNOSIS — E034 Atrophy of thyroid (acquired): Secondary | ICD-10-CM

## 2015-12-21 LAB — HEMOGLOBIN A1C
Hgb A1c MFr Bld: 5.6 % (ref <5.7)
Mean Plasma Glucose: 114 mg/dL

## 2015-12-21 LAB — T3, FREE: T3 FREE: 2.4 pg/mL (ref 2.3–4.2)

## 2015-12-21 LAB — TSH: TSH: 1.32 mIU/L

## 2015-12-21 LAB — T4, FREE: Free T4: 1.6 ng/dL (ref 0.8–1.8)

## 2015-12-22 ENCOUNTER — Other Ambulatory Visit: Payer: Self-pay | Admitting: "Endocrinology

## 2015-12-25 ENCOUNTER — Encounter: Payer: Self-pay | Admitting: "Endocrinology

## 2015-12-25 ENCOUNTER — Ambulatory Visit (INDEPENDENT_AMBULATORY_CARE_PROVIDER_SITE_OTHER): Payer: BLUE CROSS/BLUE SHIELD | Admitting: "Endocrinology

## 2015-12-25 VITALS — BP 126/70 | HR 77 | Wt 251.8 lb

## 2015-12-25 DIAGNOSIS — E049 Nontoxic goiter, unspecified: Secondary | ICD-10-CM

## 2015-12-25 DIAGNOSIS — L83 Acanthosis nigricans: Secondary | ICD-10-CM

## 2015-12-25 DIAGNOSIS — E038 Other specified hypothyroidism: Secondary | ICD-10-CM

## 2015-12-25 DIAGNOSIS — R1013 Epigastric pain: Secondary | ICD-10-CM

## 2015-12-25 DIAGNOSIS — R7303 Prediabetes: Secondary | ICD-10-CM

## 2015-12-25 DIAGNOSIS — E063 Autoimmune thyroiditis: Secondary | ICD-10-CM

## 2015-12-25 NOTE — Progress Notes (Signed)
CC: FU of acquired hypothyroidism, Hashimoto's disease, goiter, obesity, hypertension, edema, dyspepsia, pre-diabetes, hyperlipidemia, oligomenorrhea, fatigue.  HISTORY OF PRESENT ILLNESS: 45 y.o. Caucasian woman   1. Ana Murphy had her first endocrine consultation with me on or prior to 07/26/10. She had been given a diagnosis of hyperlipidemia about 25 years ago. In the past few years that I've been her endocrinologist she has firmly but politely declined to take any lipid-lowering agents. She was slender until about 1998, then began gaining weight. She developed hypothyroidism secondary to Hashimoto's Thyroiditis in the 2007-2008 period. She started Synthroid in October 2008. As she has lost more thyrocytes over time we have gradually increased her Synthroid dose. She had  been taking metformin, 500 mg, twice daily for treatment of obesity and pre-diabetes, except during the time that she was pregnant and was breast-feeding in 2013-2014. As she has gained more weight she has developed obstructive sleep apnea (OSA). She now uses a C-pap machine.   2. Her last [facility] visit was on 07/31/15. I continued her Synthroid dose of 200 mcg/day and her metformin dose of 500 mg, twice daily.    A. In March she had a 6 mm kidney stone removed..   B. Her new C-pap machine is working fairly well.   C. Her sciatica comes and goes, sometimes on the right and sometimes on the left. When she has pains or feels tired she won't exercise.   D. 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    E. She has not had any hot flashes.   F. She says that she is more conscious of what she eats. She avoids gluten due to intolerance. She has not been exercising.   G. She had physical therapy earlier in the year for vertigo associated with neck pains.   3. Pertinent Review of Systems:  Constitutional:  She feels "sleepy for the past month". Eyes: Vision is good  with her glasses. Her eye pressure was high at her exam in December 2016. There were no signs of diabetes problems.  Neck: The patient denies any problems with her anterior neck.  Heart: She has occasional palpitations when she gets anxious. The patient has no other complaints of palpitations, irregular heat beats, chest pain, or chest pressure. Gastrointestinal:  Bowel movents are more regular due to her high consumption of vegetables. She has no complaints of acid reflux, stomach aches or pains, or diarrhea. Arms: Arms sometimes get numb if she has pain in the posterior neck Legs: Lots of legs pains. Muscle mass and strength seem normal. Her sciatica comes and goes, sometimes worse on one side than the other. No edema is noted. Feet: She is no longer having much pain at the site of her previous right foot fracture. There are no other. complaints of numbness, tingling, burning, or pain. No edema is noted. GYN: Her LMP was about 12/12/15. Periods have been regular recently.   PAST MEDICAL, FAMILY, AND SOCIAL HISTORY: 1. Work and family: She has changed to an Designer, industrial/productadministrative job at [employer]..  2. Activities: Very little   3. Primary care provider: Dr. Montey HoraKathleen Rice (Dr. Josiah LoboJohn McFadden, MD) 4. OB/GYN: Dr. Clint LippsKalpen Patel, [facility], phone [phone], fax [phone]  REVIEW OF SYSTEMS: She has no other issues involving her other body systems.    PHYSICAL EXAM: BP 126/70 mmHg  Pulse 77  Wt 251 lb 12.3 oz (114.2 kg) She has lost 4  pounds since last visit.  Constitutional: The patient is alert and bright.  She is upbeat and fairly happy. She smiles a lot and jokes a lot. She does not look or act depressed today. She is still very obese. Eyes: There is no arcus or proptosis. Ocular moisture appears normal. Mouth: The oropharynx appears normal. The tongue appears normal. There is normal oral moisture.  Neck: There are no bruits present. The thyroid gland appears normal in size. The thyroid gland is  again only slightly enlarged at 20+ grams in size. The isthmus is normal. The left lobe is normal. The right lobe is only minimally enlarged. The consistency of the thyroid gland is normal. The thyroid gland is nontender today.    Lungs: The lungs are clear. Air movement is good. Heart: The heart rhythm and rate appear normal. Heart sounds S1 and S2 are normal. I do not appreciate any pathologic heart murmurs. Abdomen: The abdomen  is quite enlarged. Bowel sounds are normal. The abdomen is soft. Her abdomen is not tender to palpation today.    Arms: Muscle mass appears appropriate for age and gender. Hands: There is no obvious tremor. Phalangeal and metacarpophalangeal joints appear normal. Palms are normal. Legs: Muscle mass appears appropriate for age and gender. There is no edema of her shins today. Deep palpation of her shins produces some pain.  Neurologic: Muscle strength is normal for age and gender in both the upper and the lower extremities. Muscle tone appears normal. Sensation to touch is normal in the legs and dorsa of the feet.   LAB DATA:   Labs 12/21/15: HbA1c 5.6%; TSH 1.32, free T4 1.6, free T3 2.4  Labs 12/201/6: HbA1c 5.3%  Labs 03/15/15: HbA1c 5.4%  Labs 03/13/15: CK 105 (normal 24-173); CMP normal; iron 51; vitamin B12 589.4 (normal 211-946); PTH 28.9 (Normal 15-65); Free T4 1.64  Labs 12/26/14: HbA1c 5.6%, compared with 5.6% at last visit and 5.3% at the prior visit.   Labs 09/22/14: 25-OH vitamin D 26.7  Labs 05/22/14: TSH 1.922, free T4 1.40, free T3 2.9; 25-hydroxy vitamin D 26  Labs 11/30/13: TSH 4.445, free T4 1.30, free T3 2.4  Labs 03/23/13: TSH 6.644, free T4 1.30, free T3 2.5; 25-hydroxy Vitamin D 27; calcium 9.4;  estradiol 145.8,   Labs 12/28/12: TSH 4.18, free T4 1.5, free T3 2.4  Labs 07/22/12: HbA1c 5.6%, TSH 0.101, free T4 1.51, free T3 2.9 (on Synthroid 137 mcg/day)  ASSESSMENT:  1. Hypothyroid: She is euthyroid again now in May 2017 on her Synthroid  dose of 200 mcg/day. .  2. Obesity: Her weight decreased 4 pounds since last visit. She would still like [place] to find the "hidden metabolic cause" of her obesity.  3. Goiter: Her thyroid gland is slightly larger today in the right lobe. The process of waxing and waning of thyroid gland size is c/w ongoing thyroiditis. 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  4. Hypertension: Her BP is good.    5. Dyspepsia: This problem has improved.   6. Pre-diabetes: Her A1c is better today.    7. Hashimoto's disease: Her thyroiditis is clinically quiescent today.   8. Oligomenorrhea: She is still having menses since stopping Depo-provera.  She is still at risk for accelerated bone loss, especially if her calcium and vitamin D intake are not robust enough. She is supposed to be taking Surveyor, quantity  and vitamin D, but often forgets.   9. OSA, Fatigue/snoring,apnea: She is doing much better on CPAP therapy.  10. Acanthosis, nigricans: This is about the same, indicating ongoing resistance to insulin and hyperinsulinemia due to her obesity. This problem could reverse over time if she loses enough fat weight.  12. Vitamin D deficiency disease: She stopped vitamin D on the advice of her PCP who told her that her vitamin D levels are normal.    PLAN:  1. Diagnostic: HbA1c today and again at her next visit. TFTs and 25-OH vitamin D prior to next visit. 2. Therapeutic: Continue Synthroid at 200 mcg/day; metformin, 500 mg, twice daily; MVI daily. 3. Patient Education: It is important to eat right and to exercise right when she can. We need to address the different causes of her metabolic syndrome. I asked her to keep up the good work of eating right and try to exercise more.  4. Follow-up: 6 months.  Level of Service: This visit lasted in excess of 45 minutes. More than 50% of the  visit was devoted to counseling.  David Stall

## 2015-12-25 NOTE — Patient Instructions (Signed)
Folow up visit in 6 months. Please repeat lab tests one week prior.

## 2016-01-01 ENCOUNTER — Telehealth: Payer: Self-pay | Admitting: "Endocrinology

## 2016-01-01 NOTE — Telephone Encounter (Signed)
Routed to provider

## 2016-03-13 ENCOUNTER — Ambulatory Visit (INDEPENDENT_AMBULATORY_CARE_PROVIDER_SITE_OTHER): Payer: BLUE CROSS/BLUE SHIELD | Admitting: Internal Medicine

## 2016-03-13 ENCOUNTER — Encounter: Payer: Self-pay | Admitting: Internal Medicine

## 2016-03-13 VITALS — BP 114/76 | HR 69 | Ht 68.0 in | Wt 253.6 lb

## 2016-03-13 DIAGNOSIS — G4733 Obstructive sleep apnea (adult) (pediatric): Secondary | ICD-10-CM | POA: Diagnosis not present

## 2016-03-13 NOTE — Patient Instructions (Addendum)
Order- DME APS- please change CPAP to auto 4-15, continue mask of choice, humidifier, supplies, AirView.  Patient has some billing questions also.       Dx OSA  As discussed- you can try different styles of CPAP mask. We can refer you to learn about fitted mouth pieces to treat sleep apnea if you would like.  Please call us as needed

## 2016-03-13 NOTE — Progress Notes (Signed)
03/15/15- 44 yoF never smoker from [place] establishing for OSA.on kind referral from Dr Fransico Michael; had sleep study 11-2014 and 12-2014; first month with CPAP was able to sleep all night; then the mask started making noises(not a good seal) and wakes up from it. DME is [facility]PS. Husband has told her she stops breathing and snores loudly. She has been aware of daytime sleepiness and weight gain. She sought evaluation for hypothyroidism. Sleep habits have been unremarkable as reviewed. ENT-septoplasty. No heart or lung disease. Mild perennial allergic rhinitis treated with Flonase nasal spray. NPSG 11/30/14- AHI 100.5/ hr. Weight 262 lbs. CPAP titrated 12/11/14-15 CWP, AHI 0.0 per hour. She has been using an auto titration machine with fullface mask/APS.  07/23/2015-45 year old female never smoker from [place] followed for OSA, complicated by obesity, autoimmune thyroiditis, HBP  CPAP 15/APS FOLLOWS FOR Pt states that CPAP pressure is too strong at beginning of sleep. Pt states that she is having leaks with mask lately. Pt states using CPAP every night for at leasr 6-7 hours. No other complaints voiced   86/76//2893-45 year old female never smoker from [place] followed for OSA, complicated by obesity, autoimmune thyroiditis, HBP CPAP 14/APS Just flew in from [place] and is jetlagged, tired today. Feels smothered by CPAP, yet sleeps better with it.  Worse during recent discomfort with kidney stonesgy.  ROS-see HPI   Negative unless "+" Constitutional:    weight loss, night sweats, fevers, chills, fatigue, lassitude. HEENT:    headaches, difficulty swallowing, tooth/dental problems, sore throat,       sneezing, itching, ear ache, nasal congestion, post nasal drip, snoring CV:    chest pain, orthopnea, PND, swelling in lower extremities, anasarca,                                                  dizziness, palpitations Resp:   shortness of breath with exertion or at rest.                productive cough,   non-productive  cough, coughing up of blood.              change in color of mucus.  wheezing.   Skin:    rash or lesions. GI:  No-   heartburn, indigestion, abdominal pain, nausea, vomiting, diarrhea,                 change in bowel habits, loss of appetite GU: dysuria, change in color of urine, no urgency or frequency.   flank pain. MS:   joint pain, stiffness, decreased range of motion, back pain. Neuro-     nothing unusual Psych:  change in mood or affect.  depression or anxiety.   memory loss.  OBJ- Physical Exam General- Alert, Oriented, Affect-appropriate, Distress- none acute, +overweight  Skin- rash-none, lesions- none, excoriation- none Lymphadenopathy- none Head- atraumatic            Eyes- Gross vision intact, PERRLA, conjunctivae and secretions clear            Ears- Hearing, canals-normal            Nose- Clear, no-Septal dev, mucus, polyps, erosion, perforation             Throat- Mallampati III , mucosa clear , drainage- none, tonsils- atrophic Neck- flexible , trachea midline, no stridor , thyroid nl, carotid no bruit Chest -  symmetrical excursion , unlabored           Heart/CV- RRR , no murmur , no gallop  , no rub, nl s1 s2                           - JVD- none , edema- none, stasis changes- none, varices- none           Lung- clear to P&A, wheeze- none, cough- none , dullness-none, rub- none           Chest wall-  Abd-  Br/ Gen/ Rectal- Not done, not indicated Extrem- cyanosis- none, clubbing, none, atrophy- none, strength- nl Neuro- grossly intact to observation

## 2016-03-15 NOTE — Assessment & Plan Note (Addendum)
Despite reducing CPAP to 14, she still feels uncomfortable. We discussed oral appliances as alternatives to cpap. She wants to try harder with Cpap for now. Plan- change to autotitration

## 2016-03-17 ENCOUNTER — Ambulatory Visit: Payer: BLUE CROSS/BLUE SHIELD | Admitting: Internal Medicine

## 2016-05-26 ENCOUNTER — Ambulatory Visit: Payer: BLUE CROSS/BLUE SHIELD | Admitting: "Endocrinology

## 2016-05-26 ENCOUNTER — Telehealth (INDEPENDENT_AMBULATORY_CARE_PROVIDER_SITE_OTHER): Payer: Self-pay | Admitting: "Endocrinology

## 2016-05-26 NOTE — Telephone Encounter (Signed)
Pt would like to have orders placed to check her thyroid. She has been feeling tired and anxious.

## 2016-05-27 NOTE — Telephone Encounter (Signed)
Labs are in portal. 

## 2016-05-30 LAB — HEMOGLOBIN A1C
Hgb A1c MFr Bld: 5.3 % (ref <5.7)
Mean Plasma Glucose: 105 mg/dL

## 2016-05-31 LAB — T3, FREE: T3, Free: 2.8 pg/mL (ref 2.3–4.2)

## 2016-05-31 LAB — T4, FREE: Free T4: 1.6 ng/dL (ref 0.8–1.8)

## 2016-05-31 LAB — TSH: TSH: 1.4 mIU/L

## 2016-06-04 ENCOUNTER — Telehealth (INDEPENDENT_AMBULATORY_CARE_PROVIDER_SITE_OTHER): Payer: Self-pay

## 2016-06-04 NOTE — Telephone Encounter (Signed)
  Who's calling (name and relationship to patient) :Ana Murphy  Best contact number: mobile #, sorry I did not copy # in chart  Provider they see: Fransico MichaelBrennan  Reason for call:Wants lab results     PRESCRIPTION REFILL ONLY  Name of prescription:  Pharmacy:

## 2016-06-04 NOTE — Telephone Encounter (Signed)
Routed to provider

## 2016-06-09 NOTE — Telephone Encounter (Signed)
Patient called again requesting lab results. If patient does not answer, please leave a detailed message.

## 2016-06-09 NOTE — Telephone Encounter (Signed)
Routed to provider

## 2016-06-12 NOTE — Telephone Encounter (Signed)
See 2nd note. 

## 2016-06-26 NOTE — Telephone Encounter (Signed)
Routed to provider

## 2016-06-26 NOTE — Telephone Encounter (Signed)
Patient is calling again requesting lab results. Please leave detailed message on machine if she does not answer.

## 2016-06-26 NOTE — Telephone Encounter (Signed)
To provider

## 2016-06-27 ENCOUNTER — Other Ambulatory Visit: Payer: Self-pay | Admitting: "Endocrinology

## 2016-06-30 ENCOUNTER — Telehealth (INDEPENDENT_AMBULATORY_CARE_PROVIDER_SITE_OTHER): Payer: Self-pay | Admitting: "Endocrinology

## 2016-06-30 NOTE — Telephone Encounter (Signed)
Routed to Dr. Brennan 

## 2016-06-30 NOTE — Telephone Encounter (Signed)
Patient is calling again to get lab results.

## 2016-06-30 NOTE — Telephone Encounter (Signed)
1. Ms. Ana Murphy called to discuss her lab tests.  2. Subjective: She is feeling well.  3. Objective: Lab tests 05/30/16: HbA1c 5.3%, much better. TFTs are mid-range normal.  4. Assessment: She is doing better in terms of A1c while taking her metformin. Her TFTs are mid-range normal on her current Synthroid dose of 200 mcg/day. 5. Plan: Continue her current metformin and Synthroid doses. Follow up appointments and lab tests as planned. David StallBRENNAN,MICHAEL J, MD, CDE Adult and Pediatric Endocrinology

## 2016-07-21 ENCOUNTER — Encounter (INDEPENDENT_AMBULATORY_CARE_PROVIDER_SITE_OTHER): Payer: Self-pay | Admitting: "Endocrinology

## 2016-07-21 ENCOUNTER — Ambulatory Visit (INDEPENDENT_AMBULATORY_CARE_PROVIDER_SITE_OTHER): Payer: BLUE CROSS/BLUE SHIELD | Admitting: "Endocrinology

## 2016-07-21 ENCOUNTER — Telehealth (INDEPENDENT_AMBULATORY_CARE_PROVIDER_SITE_OTHER): Payer: Self-pay | Admitting: "Endocrinology

## 2016-07-21 VITALS — BP 132/80 | HR 84 | Wt 262.0 lb

## 2016-07-21 DIAGNOSIS — E049 Nontoxic goiter, unspecified: Secondary | ICD-10-CM

## 2016-07-21 DIAGNOSIS — G4733 Obstructive sleep apnea (adult) (pediatric): Secondary | ICD-10-CM | POA: Diagnosis not present

## 2016-07-21 DIAGNOSIS — E063 Autoimmune thyroiditis: Secondary | ICD-10-CM

## 2016-07-21 DIAGNOSIS — E559 Vitamin D deficiency, unspecified: Secondary | ICD-10-CM

## 2016-07-21 DIAGNOSIS — E038 Other specified hypothyroidism: Secondary | ICD-10-CM | POA: Diagnosis not present

## 2016-07-21 DIAGNOSIS — R7303 Prediabetes: Secondary | ICD-10-CM | POA: Diagnosis not present

## 2016-07-21 DIAGNOSIS — R1013 Epigastric pain: Secondary | ICD-10-CM

## 2016-07-21 DIAGNOSIS — I10 Essential (primary) hypertension: Secondary | ICD-10-CM

## 2016-07-21 MED ORDER — RANITIDINE HCL 150 MG PO TABS: 150.0000 mg | ORAL_TABLET | Freq: Two times a day (BID) | ORAL | 6 refills | Status: DC

## 2016-07-21 NOTE — Patient Instructions (Addendum)
Follow up visit in 4 months. Repeat lab tests one week prior.

## 2016-07-21 NOTE — Telephone Encounter (Signed)
Made in error. Ana Murphy °

## 2016-07-21 NOTE — Progress Notes (Signed)
CC: FU of acquired hypothyroidism, Hashimoto's disease, goiter, obesity, hypertension, edema, dyspepsia, pre-diabetes, hyperlipidemia, oligomenorrhea, fatigue.  HISTORY OF PRESENT ILLNESS: 45 y.o. Caucasian woman   1. Ana Murphy had her first endocrine consultation with me on or prior to 07/26/10. She had been given a diagnosis of hyperlipidemia about 25 years ago. In the past few years that I've been her endocrinologist she has firmly but politely declined to take any lipid-lowering agents. She was slender until about 1998, then began gaining weight. She developed hypothyroidism secondary to Hashimoto's Thyroiditis in the 2007-2008 period. She started Synthroid in October 2008. As she has lost more thyrocytes over time we have gradually increased her Synthroid dose. She had  been taking metformin, 500 mg, twice daily for treatment of obesity and pre-diabetes, except during the time that she was pregnant and was breast-feeding in 2013-2014. As she has gained more weight she has developed obstructive sleep apnea (OSA). She now uses a C-pap machine.   2. Her last [facility] visit was on 12/25/15. I continued her Synthroid dose of 200 mcg/day and her metformin dose of 500 mg, twice daily.    A. In the interim she has been healthy overall, but she developed some tendinitis of the right elbow. She also developed a URI today. She has not had a recurrence of  kidney stones.    B. She is having more problems with sleeping. She thinks that part of the problem is that her C-pap machine is not working as well. She saw Dr. Maple Hudson several months ago and he made some changes in her settings. Those changes worked for Ana Murphy, but her sleep is worse again. She can't sleep more than 3-4 hours with the C-pap mask, so she then takes it off.   C. Her sciatica comes and goes, sometimes on the right and sometimes on the left. When she has pains or feels tired she will not exercise.   D. 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     E. She has not had any hot flashes since resuming regular menses..   F. She says that she gained weight three months ago due to her feeling much hungrier. She has also been taking more Motrin and naproxen which also cause her to be hungrier.  When she gets upset she is a Copy eater". She says that she avoids gluten due to intolerance, but she also still eats pasta. She has not been exercising.   G. She often has low BG symptoms on Saturday mornings at about 9:00-10:00, about 2 hours after breakfast, usually when she is rushing through her housekeeping activities.    3. Pertinent Review of Systems:  Constitutional:  She feels "fine-OK" physically.  Eyes: Vision is good with her glasses. Her eye pressure was high at her exam in December 2016. She had a follow up exam in about May 2017. Her eye pressure was good then. There were no signs of diabetes problems.  Neck: The patient denies any problems with her anterior neck.  Heart: The patient has no complaints of palpitations, irregular heat beats, chest pain, or chest pressure. Gastrointestinal: She has intermittent belly hunger.  Bowel movents are regular. She has no complaints of acid reflux, stomach aches or pains, or diarrhea. Arms: Posterior neck pains come and go.  Legs: Muscle mass and strength seem normal. Her sciatica comes and goes, sometimes worse on one side than the other. No edema is noted.  Feet: There are no complaints of numbness, tingling, burning, or pain. No edema is noted. GYN: Her LMP was about 06/28/16. Periods have been regular for about the past year.    PAST MEDICAL, FAMILY, AND SOCIAL HISTORY: 1. 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  2. Activities: Very little   3. Primary care provider: Dr. Montey HoraKathleen Murphy (Dr. Josiah LoboJohn McFadden, MD) 4. OB/GYN: Dr. Clint LippsKalpen Murphy, [facility], phone [phone], fax [phone]  REVIEW OF SYSTEMS: She has no  other issues involving her other body systems.    PHYSICAL EXAM: BP 132/80   Pulse 84   Wt 262 lb (118.8 kg)   BMI 39.84 kg/m    Constitutional: She is more obese. She has gained 11 pounds since her last visit, equivalent to about an excess of 155 calories per day. She is alert and bright. She is frustrated by her weight gain. She does not look or act depressed today.  Eyes: There is no arcus or proptosis. Ocular moisture appears normal. Mouth: The oropharynx appears normal. The tongue appears normal. There is normal oral moisture.  Neck: There are no bruits present. The thyroid gland appears normal in size. The thyroid gland is smaller and normal in size today. The consistency of the thyroid gland is normal. The thyroid gland is nontender today.    Lungs: The lungs are clear. Air movement is good. Heart: The heart rhythm and rate appear normal. Heart sounds S1 and S2 are normal. I do not appreciate any pathologic heart murmurs. Abdomen: The abdomen  is quite enlarged. Bowel sounds are normal. The abdomen is soft. Her abdomen is not tender to palpation today.    Arms: Muscle mass appears appropriate for age and gender. Hands: There is no obvious tremor. Phalangeal and metacarpophalangeal joints appear normal. Palms are normal. Legs: Muscle mass appears appropriate for age and gender. There is no edema of her shins today. Deep palpation of her shins produces no pain today.  Neurologic: Muscle strength is normal for age and gender in both the upper and the lower extremities. Muscle tone appears normal. Sensation to touch is normal in the legs.  LAB DATA:   Labs 07/21/16: HbA1c 5.3%  Labs 05/30/16: TSH 1.40, free T4 1.6, free T3 2.8  Labs 12/21/15: HbA1c 5.6%; TSH 1.32, free T4 1.6, free T3 2.4  Labs 12/201/6: HbA1c 5.3%  Labs 03/15/15: HbA1c 5.4%  Labs 03/13/15: CK 105 (normal 24-173); CMP normal; iron 51; vitamin B12 589.4 (normal 211-946); PTH 28.9 (Normal 15-65); Free T4 1.64  Labs  12/26/14: HbA1c 5.6%, compared with 5.6% at last visit and 5.3% at the prior visit.   Labs 09/22/14: 25-OH vitamin D 26.7  Labs 05/22/14: TSH 1.922, free T4 1.40, free T3 2.9; 25-hydroxy vitamin D 26  Labs 11/30/13: TSH 4.445, free T4 1.30, free T3 2.4  Labs 03/23/13: TSH 6.644, free T4 1.30, free T3 2.5; 25-hydroxy Vitamin D 27; calcium 9.4;  estradiol 145.8,   Labs 12/28/12: TSH 4.18, free T4 1.5, free T3 2.4  Labs 07/22/12: HbA1c 5.6%, TSH 0.101, free T4 1.51, free T3 2.9 (on Synthroid 137 mcg/day)  ASSESSMENT:  1. Hypothyroid: She was euthyroid again on October 2017 on her Synthroid dose of 200 mcg/day. .  2. Obesity: Her weight increased 11 pounds since last visit. She still finds it difficult to control her appetite and eating. When she has dyspepsia she feels like she needs to eat and does. Her increased use of Motrin and  naproxen also increases her sense of dyspepsia. Despite having shown her our Eat Right Diet in the past, she continues to eat [place] food when she feels upset. She still really does not accept that her consuming more calories than she burns off is the cause of her obesity. She would still like us to find the "hidden metabolic cause" of her obesity.  3. Goiter: Her thyroid gland is smaller today. The process of waxing and waning of thyroid gland size is c/w ongoing thyroiditis. 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  4. Hypertension: Her BP is higher. She needs to eat right, exercise, and lose weight.     5. Dyspepsia: This problem has worsened.  6. Pre-diabetes: Her A1c is better today.    7. Hashimoto's disease: Her thyroiditis is clinically quiescent today.   8. Oligomenorrhea: Resolved.   9. OSA, Fatigue/snoring,apnea: She is doing much worse. I asked her to call Dr. Maple HudsonYoung.   10. Acanthosis, nigricans:  This is about the same, indicating ongoing resistance to insulin and hyperinsulinemia due to her obesity. This problem could reverse over time if she loses enough fat weight.  12. Vitamin D deficiency disease: She stopped vitamin D on the advice of her PCP who told her that her vitamin D levels are normal.    PLAN:  1. Diagnostic: HbA1c today and again at her next visit. TFTs and 25-OH vitamin D prior to next visit. 2. Therapeutic: Continue Synthroid at 200 mcg/day; metformin, 500 mg, twice daily; MVI daily. Add ranitidine, 150 mg, twice daily. 3. Patient Education: It is important to eat right and to exercise right when she can. We need to address the different causes of her metabolic syndrome. I asked her to try to take Tylenol more frequently and Motrin and naproxen less frequently.   4. Follow-up: 4 months.  Level of Service: This visit lasted in excess of 45 minutes. More than 50% of the visit was devoted to counseling.  David StallBRENNAN,MICHAEL J, MD, CDE Adult and Pediatric Endocrinology

## 2016-12-15 ENCOUNTER — Encounter (INDEPENDENT_AMBULATORY_CARE_PROVIDER_SITE_OTHER): Payer: Self-pay | Admitting: "Endocrinology

## 2016-12-15 ENCOUNTER — Ambulatory Visit (INDEPENDENT_AMBULATORY_CARE_PROVIDER_SITE_OTHER): Payer: BLUE CROSS/BLUE SHIELD | Admitting: "Endocrinology

## 2016-12-15 VITALS — BP 122/78 | HR 86 | Wt 263.2 lb

## 2016-12-15 DIAGNOSIS — E049 Nontoxic goiter, unspecified: Secondary | ICD-10-CM

## 2016-12-15 DIAGNOSIS — R7303 Prediabetes: Secondary | ICD-10-CM | POA: Diagnosis not present

## 2016-12-15 DIAGNOSIS — E559 Vitamin D deficiency, unspecified: Secondary | ICD-10-CM

## 2016-12-15 DIAGNOSIS — Z6841 Body Mass Index (BMI) 40.0 and over, adult: Secondary | ICD-10-CM

## 2016-12-15 DIAGNOSIS — E66813 Obesity, class 3: Secondary | ICD-10-CM

## 2016-12-15 DIAGNOSIS — E063 Autoimmune thyroiditis: Secondary | ICD-10-CM | POA: Diagnosis not present

## 2016-12-15 LAB — POCT GLUCOSE (DEVICE FOR HOME USE): POC GLUCOSE: 84 mg/dL (ref 70–99)

## 2016-12-15 LAB — POCT GLYCOSYLATED HEMOGLOBIN (HGB A1C): Hemoglobin A1C: 5.4

## 2016-12-15 LAB — T3, FREE: T3 FREE: 2.6 pg/mL (ref 2.3–4.2)

## 2016-12-15 LAB — TSH: TSH: 0.51 mIU/L

## 2016-12-15 LAB — T4, FREE: Free T4: 1.7 ng/dL (ref 0.8–1.8)

## 2016-12-15 NOTE — Patient Instructions (Signed)
Follow up visit in 4 months.  

## 2016-12-15 NOTE — Progress Notes (Signed)
CC: FU of acquired hypothyroidism, Hashimoto's disease, goiter, obesity, hypertension, edema, dyspepsia, pre-diabetes, hyperlipidemia, oligomenorrhea, fatigue.  HISTORY OF PRESENT ILLNESS: 46 y.o. Caucasian woman   1. Ana Murphy had her first endocrine consultation with me on or prior to 07/26/10. She had been given a diagnosis of hyperlipidemia about 25 years ago. In the past few years that I've been her endocrinologist she has firmly but politely declined to take any lipid-lowering agents. She was slender until about 1998, then began gaining weight. She developed hypothyroidism secondary to Hashimoto's Thyroiditis in the 2007-2008 period. She started Synthroid in October 2008. As she has lost more thyrocytes over time we have gradually increased her Synthroid dose. She had  been taking metformin, 500 mg, twice daily for treatment of obesity and pre-diabetes, except during the time that she was pregnant and was breast-feeding in 2013-2014. As she has gained more weight she has developed obstructive sleep apnea (OSA). She now uses a C-pap machine.   2. Her last [facility] visit was on 07/21/16. I continued her Synthroid dose of 200 mcg/day and her metformin dose of 500 mg, twice daily.  I added ranitidine, 150 mg, twice daily.   A. In the interim she developed stress-related diarrhea. She saw a GI specialist in [place] who diagnosed IBS, took her off ranitidine, gave her a short course of Nexium, then put her on IBGuard.     B. She had PT in January and February for her cervical pains. A CT scan showed a bulging disc in her neck. She is no longer having the cervical pains now.   C. She thinks that her C-pap machine is working well.   D. Her sciatica comes and goes, sometimes on the right and sometimes on the left. When she has pains or feels tired she will not exercise.   E. 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 She is looking for an alternate job now, one with health  insurance benefits..      E. She has not had any hot flashes since resuming regular menses..   F. Her belly hunger is better. She says that she avoids gluten due to intolerance. She has not been exercising because the exercise makes her feel hot, sticky, and sweaty.    G. She no longer has any low BG symptoms.    3. Pertinent Review of Systems:  Constitutional:  She feels "fine physically". She is very unhappy about her job situation.   Eyes: Vision is good with her glasses. Her eye pressure was high at her exam in December 2016. She had a follow up exam in about May 2017. Her eye pressure was good then. There were no signs of diabetes problems. She has a follow up exam on 12/31/16.  Neck: The patient denies any problems with her anterior neck.  Heart: The patient has no complaints of palpitations, irregular heat beats, chest pain, or chest pressure. Gastrointestinal: As above. Bowel movents are regular. She has no complaints of acid reflux, stomach aches or pains, or diarrhea. Arms: Her previous arm pains due to pinching of her cervical nerves have resolved.  Legs: Muscle mass and strength seem normal. Her sciatica comes and goes, sometimes worse on one side than the other. No edema is noted. Feet: There are no complaints of numbness, tingling, burning, or pain. No edema is noted. GYN: Her LMP was about 12/12/16. Periods have been regular for about the past year.    PAST MEDICAL, FAMILY,  AND SOCIAL HISTORY: 1. 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  2. Activities: Very little   3. Primary care provider: Dr. Montey HoraKathleen Rice (Dr. Josiah LoboJohn McFadden, MD) 4. OB/GYN: Dr. Clint LippsKalpen Patel, [facility], phone [phone], fax [phone]  REVIEW OF SYSTEMS: She has no other issues involving her other body systems.    PHYSICAL EXAM: BP 122/78   Pulse 86   Wt 263 lb 3.2 oz (119.4 kg)   BMI 40.02 kg/m    Constitutional: She is still obese. She has gained 1 pound since her  last visit. She is alert and fairly bright, but is unhappy when she discusses her job situation. She does not look or act overtly depressed today.  Eyes: There is no arcus or proptosis. Ocular moisture appears normal. Mouth: The oropharynx appears normal. The tongue appears normal. There is normal oral moisture.  Neck: There are no bruits present. The thyroid gland appears normal in size. The thyroid gland is slightly larger at about 20+ grams in size. The left lobe is slightly enlarged, while the right lobe is normal. The consistency of the thyroid gland is normal. The thyroid gland is nontender today.    Lungs: The lungs are clear. Air movement is good. Heart: The heart rhythm and rate appear normal. Heart sounds S1 and S2 are normal. I do not appreciate any pathologic heart murmurs. Abdomen: The abdomen  is quite enlarged. Bowel sounds are normal. The abdomen is soft. Her abdomen is not tender to palpation today.    Arms: Muscle mass appears appropriate for age and gender. Hands: There is no obvious tremor. Phalangeal and metacarpophalangeal joints appear normal. Palms are normal. Legs: Muscle mass appears appropriate for age and gender. There is no edema of her shins today. Deep palpation of her shins produces no pain today.  Neurologic: Muscle strength is normal for age and gender in both the upper and the lower extremities. Muscle tone appears normal. Sensation to touch is normal in the legs.  LAB DATA:   Labs 12/15/16: HbA1c 5.4%, CBG 84  Labs 07/21/16: HbA1c 5.3%  Labs 05/30/16: TSH 1.40, free T4 1.6, free T3 2.8  Labs 12/21/15: HbA1c 5.6%; TSH 1.32, free T4 1.6, free T3 2.4  Labs 12/201/6: HbA1c 5.3%  Labs 03/15/15: HbA1c 5.4%  Labs 03/13/15: CK 105 (normal 24-173); CMP normal; iron 51; vitamin B12 589.4 (normal 211-946); PTH 28.9 (Normal 15-65); Free T4 1.64  Labs 12/26/14: HbA1c 5.6%, compared with 5.6% at last visit and 5.3% at the prior visit.   Labs 09/22/14: 25-OH vitamin D  26.7  Labs 05/22/14: TSH 1.922, free T4 1.40, free T3 2.9; 25-hydroxy vitamin D 26  Labs 11/30/13: TSH 4.445, free T4 1.30, free T3 2.4  Labs 03/23/13: TSH 6.644, free T4 1.30, free T3 2.5; 25-hydroxy Vitamin D 27; calcium 9.4;  estradiol 145.8,   Labs 12/28/12: TSH 4.18, free T4 1.5, free T3 2.4  Labs 07/22/12: HbA1c 5.6%, TSH 0.101, free T4 1.51, free T3 2.9 (on Synthroid 137 mcg/day)  ASSESSMENT:  1. Hypothyroid: She was euthyroid again on October 2017 on her Synthroid dose of 200 mcg/day. She forgot to have her TFTs done prior to this visit, so we will obtain them now.  2. Obesity: Her weight increased 1 pound since last visit. She still finds it difficult to control her appetite and eating. She heartily dislikes exercise. She still really does not accept that her consuming more calories than she burns off is the cause  of her obesity. She is still hoping that we will find the "hidden metabolic cause" of her obesity.  3. Goiter: Her thyroid gland is slightly larger today. The process of waxing and waning of thyroid gland size is c/w ongoing thyroiditis. 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  4. Hypertension: Her BP is lower. She needs to eat right, exercise, and lose weight.     5. Dyspepsia: This problem has probably improved.   6. Pre-diabetes: Her A1c is a bit higher today, but still WNL.    7. Hashimoto's disease: Her thyroiditis is clinically quiescent today.   8. Oligomenorrhea: Resolved.   9. OSA, Fatigue/snoring,apnea: She is doing much better.    10. Acanthosis, nigricans: This is about the same, indicating ongoing resistance to insulin and hyperinsulinemia due to her obesity. This problem could reverse over time if she loses enough fat weight.  12. Vitamin D deficiency disease: She stopped vitamin D on the advice of her  PCP who told her that her vitamin D levels are normal.    PLAN:  1. Diagnostic: HbA1c today and again at her next visit. TFTs and 25-OH vitamin D today.  2. Therapeutic: Continue Synthroid at 200 mcg/day; metformin, 500 mg, twice daily; MVI daily.Try to exercise daily.  3. Patient Education: It is important to eat right and to exercise right when she can. She does need to have a job with good insurance benefits. 4. Follow-up: 4 months.  Level of Service: This visit lasted in excess of 45 minutes. More than 50% of the visit was devoted to counseling.  Molli Knock, MD, CDE Adult and Pediatric Endocrinology

## 2016-12-16 LAB — VITAMIN D 25 HYDROXY (VIT D DEFICIENCY, FRACTURES): VIT D 25 HYDROXY: 19 ng/mL — AB (ref 30–100)

## 2016-12-23 ENCOUNTER — Other Ambulatory Visit: Payer: Self-pay | Admitting: "Endocrinology

## 2016-12-31 LAB — HM DIABETES EYE EXAM

## 2017-03-25 IMAGING — CT CT RENAL STONE PROTOCOL
2 of 4 series · 16 of 46 positions shown, 18 images · non-contrast
Comparison: None.

CLINICAL DATA: Left flank pain radiating to left lower
quadrant/groin, dysuria

EXAM:
CT ABDOMEN AND PELVIS WITHOUT CONTRAST
TECHNIQUE: Multidetector CT imaging of the abdomen and pelvis was performed
following the standard protocol without IV contrast.

[Series 2: axial st · axial · 0.98mm/px · z∈[-418,+27]mm · 13 of 97 slices shown, 15 images]
[im 4/97  soft-tissue]
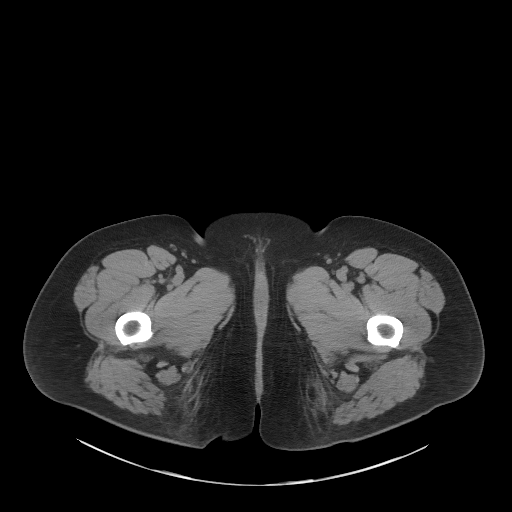
[im 4/97  bone]
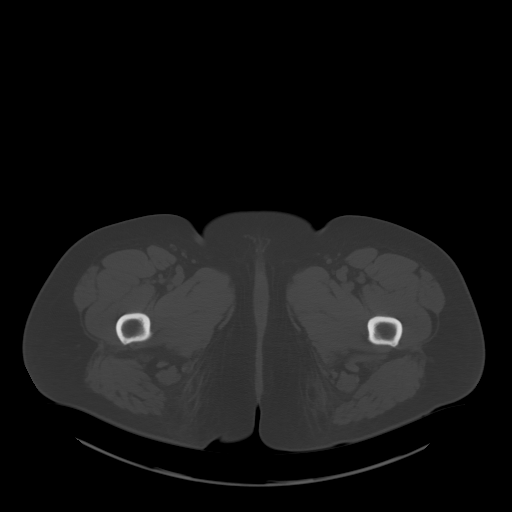
[im 12/97  soft-tissue]
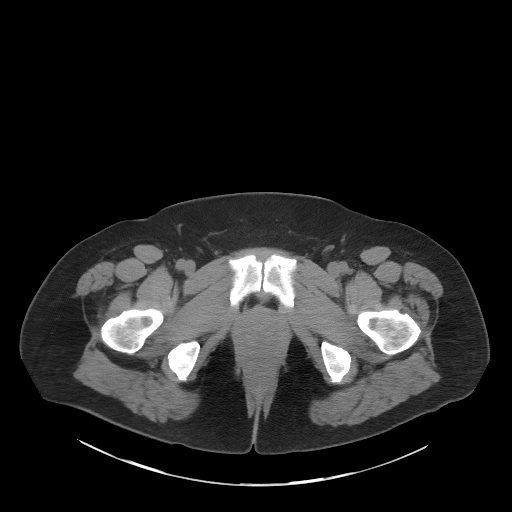
[im 19/97  soft-tissue]
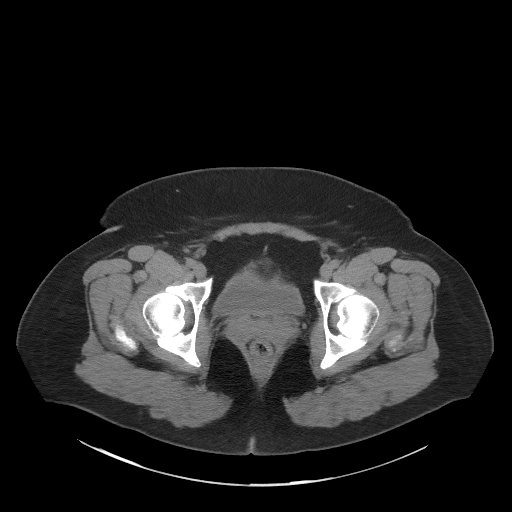
[im 26/97  soft-tissue]
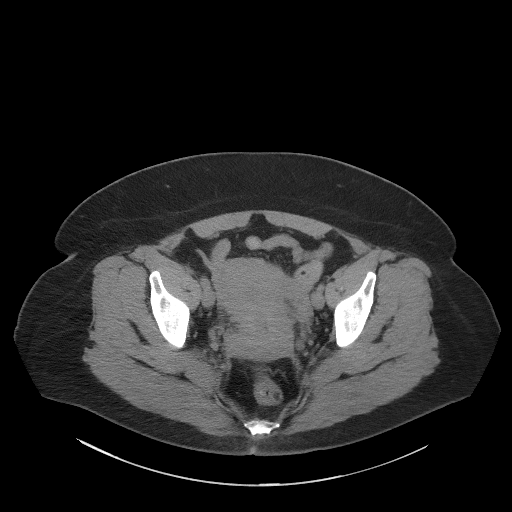
[im 34/97  soft-tissue]
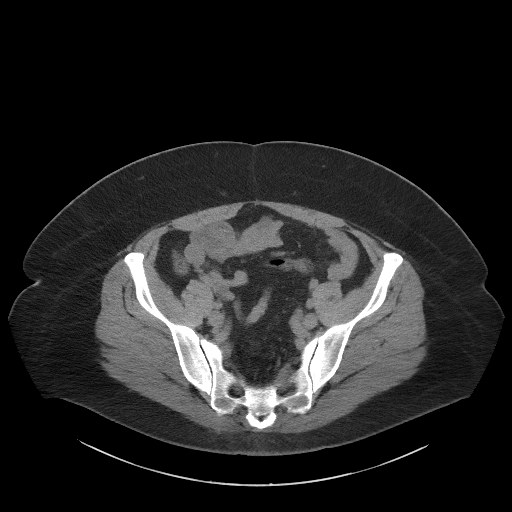
[im 41/97  soft-tissue]
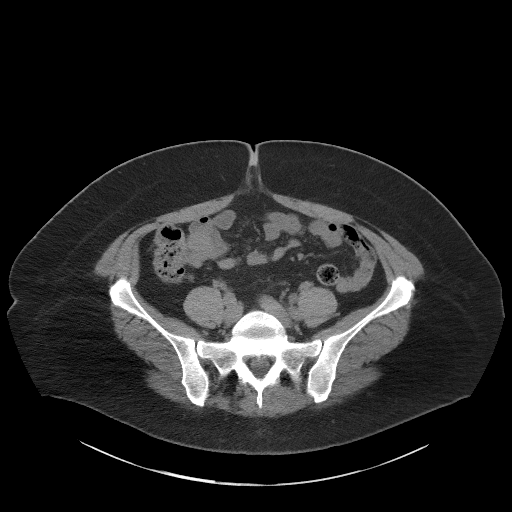
[im 49/97  soft-tissue]
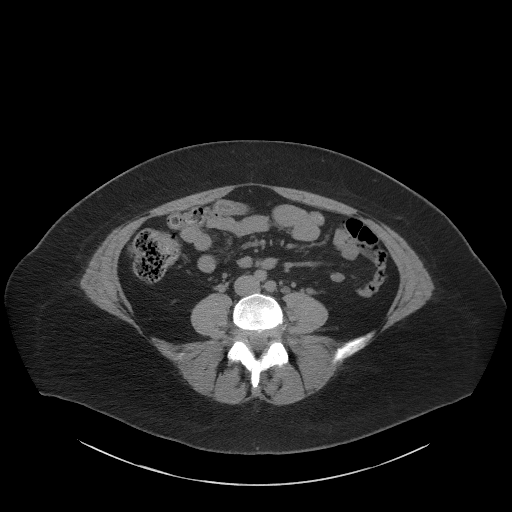
[im 56/97  soft-tissue]
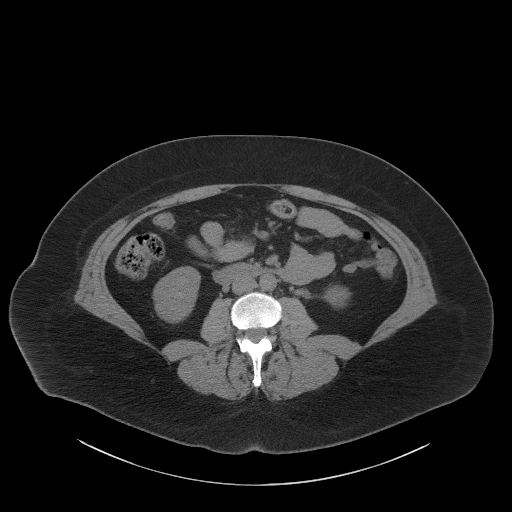
[im 63/97  soft-tissue]
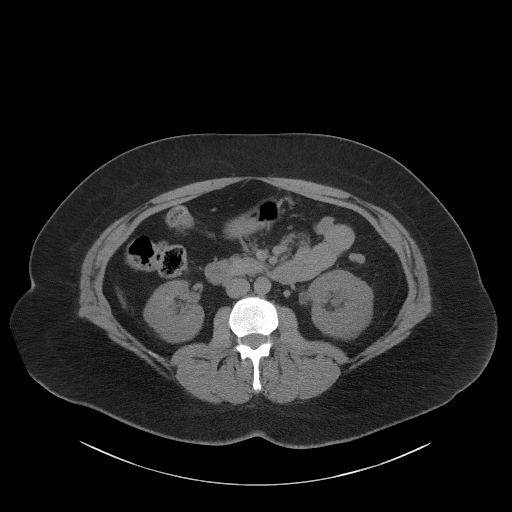
[im 63/97  bone]
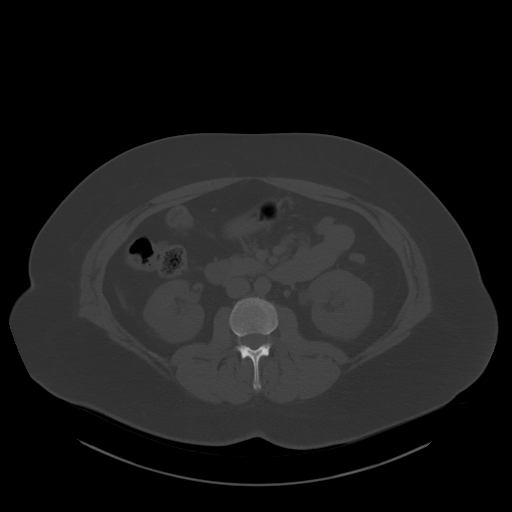
[im 71/97  soft-tissue]
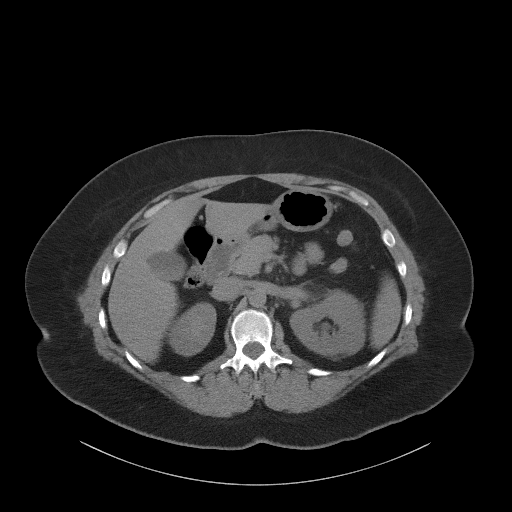
[im 78/97  soft-tissue]
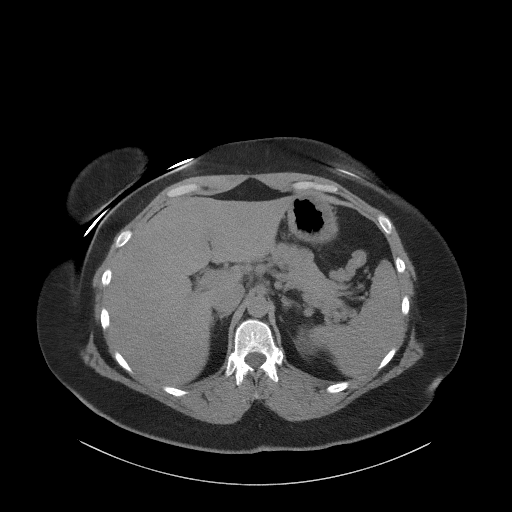
[im 85/97  soft-tissue]
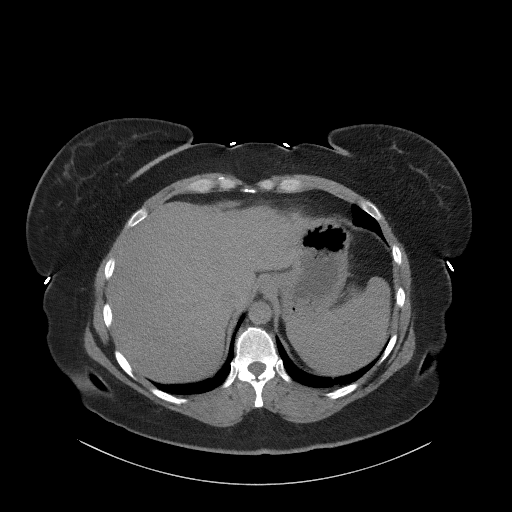
[im 93/97  soft-tissue]
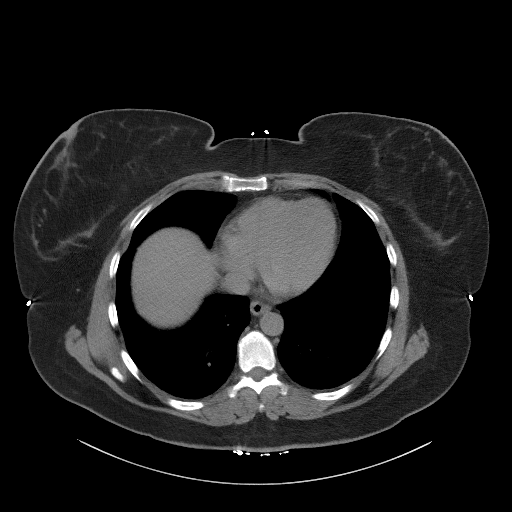

[Series 5: coronal st · coronal · 0.95mm/px · 3 of 96 slices shown]
[im 32/96  soft-tissue]
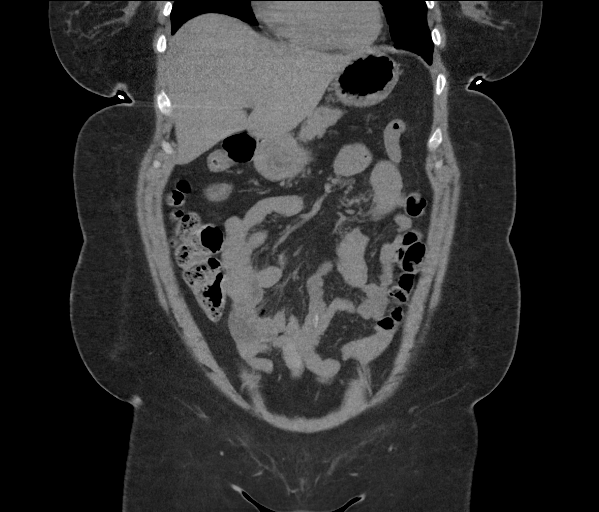
[im 43/96  soft-tissue]
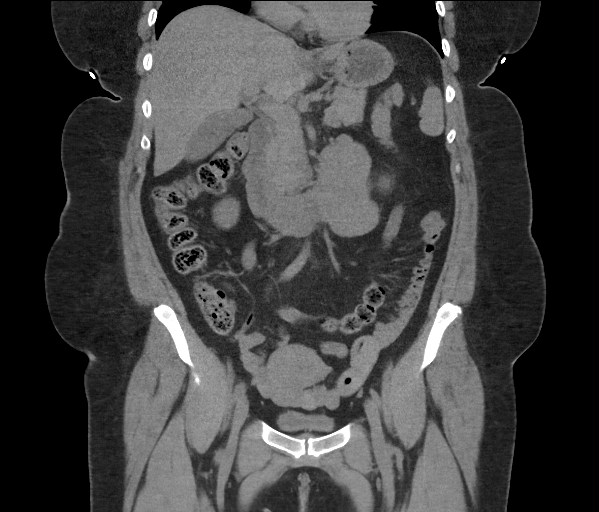
[im 53/96  soft-tissue]
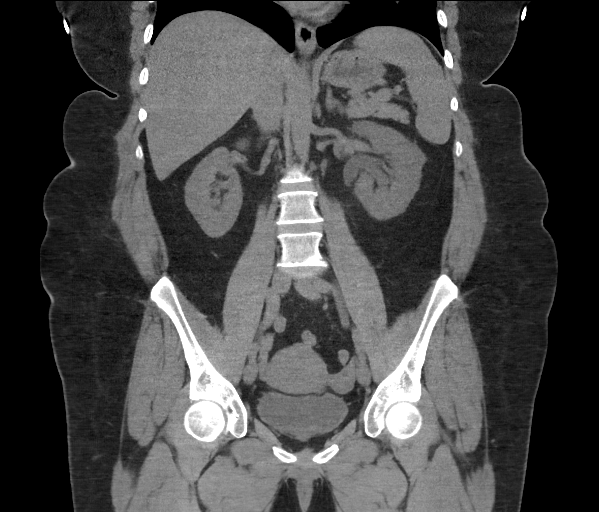

[16 of 46 positions shown; findings below may reference images not displayed]

FINDINGS: Lower chest:  Lung bases are clear.

Hepatobiliary: Mild hepatic steatosis.

Gallbladder is notable for layering sludge (series 2/ image 26). No
associated inflammatory changes.

Pancreas: Within normal limits.

Spleen: Within normal limits.

Adrenals/Urinary Tract: Adrenal glands within normal limits.

Right kidney is within normal limits.

Mild left perirenal edema with nonspecific stranding. Mild left
hydroureteronephrosis.

6 mm distal left ureteral calculus just above the UVJ (series
2/image 76).

Bladder is underdistended but unremarkable.

Stomach/Bowel: Stomach is within normal limits.

No evidence of bowel obstruction.

Normal appendix (series 2/image 59).

Vascular/Lymphatic: No evidence of abdominal aortic aneurysm.

No suspicious abdominopelvic lymphadenopathy.

Reproductive: Uterus is within normal limits.

Bilateral ovaries within normal limits

Other: No abdominopelvic ascites.

Musculoskeletal: Grade 1 spondylolisthesis at L4-5.
IMPRESSION: 6 mm distal left ureteral calculus just above the UVJ. Mild left
hydroureteronephrosis.

Mild hepatic steatosis.

## 2017-05-25 ENCOUNTER — Ambulatory Visit (INDEPENDENT_AMBULATORY_CARE_PROVIDER_SITE_OTHER): Payer: BLUE CROSS/BLUE SHIELD | Admitting: "Endocrinology

## 2017-06-08 ENCOUNTER — Ambulatory Visit (INDEPENDENT_AMBULATORY_CARE_PROVIDER_SITE_OTHER): Payer: BLUE CROSS/BLUE SHIELD | Admitting: "Endocrinology

## 2017-06-08 ENCOUNTER — Encounter (INDEPENDENT_AMBULATORY_CARE_PROVIDER_SITE_OTHER): Payer: Self-pay | Admitting: "Endocrinology

## 2017-06-08 VITALS — BP 118/78 | HR 68 | Wt 236.8 lb

## 2017-06-08 DIAGNOSIS — Z6835 Body mass index (BMI) 35.0-35.9, adult: Secondary | ICD-10-CM

## 2017-06-08 DIAGNOSIS — I10 Essential (primary) hypertension: Secondary | ICD-10-CM | POA: Diagnosis not present

## 2017-06-08 DIAGNOSIS — R7303 Prediabetes: Secondary | ICD-10-CM

## 2017-06-08 DIAGNOSIS — E049 Nontoxic goiter, unspecified: Secondary | ICD-10-CM | POA: Diagnosis not present

## 2017-06-08 DIAGNOSIS — E559 Vitamin D deficiency, unspecified: Secondary | ICD-10-CM | POA: Diagnosis not present

## 2017-06-08 DIAGNOSIS — E063 Autoimmune thyroiditis: Secondary | ICD-10-CM | POA: Diagnosis not present

## 2017-06-08 DIAGNOSIS — N914 Secondary oligomenorrhea: Secondary | ICD-10-CM

## 2017-06-08 DIAGNOSIS — E661 Drug-induced obesity: Secondary | ICD-10-CM | POA: Diagnosis not present

## 2017-06-08 LAB — POCT GLYCOSYLATED HEMOGLOBIN (HGB A1C): HEMOGLOBIN A1C: 5.3

## 2017-06-08 LAB — POCT GLUCOSE (DEVICE FOR HOME USE): POC GLUCOSE: 103 mg/dL — AB (ref 70–99)

## 2017-06-08 NOTE — Patient Instructions (Signed)
Follow up visit in 3 months. 

## 2017-06-08 NOTE — Progress Notes (Signed)
CC: FU of acquired hypothyroidism, Hashimoto's disease, goiter, obesity, hypertension, edema, dyspepsia, pre-diabetes, hyperlipidemia, oligomenorrhea, fatigue.  HISTORY OF PRESENT ILLNESS: 46 y.o. Caucasian woman. She was accompanied by her husband.   1. Ana Murphy had her first endocrine consultation with me on or prior to 07/26/10. She had been given a diagnosis of hyperlipidemia about 25 years ago. In the past few years that I've been her endocrinologist she has firmly but politely declined to take any lipid-lowering agents. She was slender until about 1998, then began gaining weight. She developed hypothyroidism secondary to Hashimoto's Thyroiditis in the 2007-2008 period. She started Synthroid in October 2008. As she has lost more thyrocytes over time we have gradually increased her Synthroid dose. She had  been taking metformin, 500 mg, twice daily for treatment of obesity and pre-diabetes, except during the time that she was pregnant and was breast-feeding in 2013-2014. As she has gained more weight she has developed obstructive sleep apnea (OSA). She now uses a C-pap machine.   2. Her last [facility] visit was on 12/15/16. I continued her Synthroid dose of 200 mcg/day and her metformin dose of 500 mg, twice daily. She  Is also taking 1000 IU of vitamin D per day.   A. In the interim she developed stress-related diarrhea. She saw a GI specialist in [place] who diagnosed IBS, took her off ranitidine, then put her on IBGuard. Overall she is doing better.  B. She had PT in January and February for her cervical pains. A CT scan showed a bulging disc in her neck. She is no longer having many cervical pains now.   C. She thinks that her C-pap machine is working well.   D. Her sciatica comes and goes, more on the right than on the left. She took prednisone for one month, which resulted in some improvement. She will see a neurologist soon.   E. She has not had any hot flashes since resuming regular menses..   F. Her  belly hunger is better. She says that she avoids gluten due to intolerance. She has not been exercising because the exercise makes her feel hot, sticky, and sweaty.    G. She no longer has any low BG symptoms.    3. Pertinent Review of Systems:  Constitutional:  She feels "fine". She does not feel hyper in any way.  Eyes: Vision is good with her glasses. Her eye pressure was high at her exam in December 2016. She had a follow up exam in about May 2017. Her eye pressure was good then. There were no signs of diabetes problems. She had a follow up exam on 12/31/16 and her eye pressure was good then. .  Neck: The patient denies any problems with her anterior neck.  Heart: The patient has no complaints of palpitations, irregular heat beats, chest pain, or chest pressure. Gastrointestinal: Bowel movents are fairly regular, but she still has diarrhea when she gets emotionally upset. . She has no complaints of acid reflux, stomach aches or pains. Arms: Her previous arm pains due to pinching of her cervical nerves have resolved.  Legs: Muscle mass and strength seem normal. Her sciatica comes and goes, sometimes worse on one side than the other. No edema is noted. Feet: There are no complaints of numbness, tingling, burning, or pain. No edema is noted. GYN: Her LMP was about 05/24/17. Periods have been regular for about the past year.    PAST MEDICAL, FAMILY, AND SOCIAL HISTORY: 1. Work and family: She resigned  her job at [employer]. She is looking for anew job now. 2. Activities: Very little   3. Primary care provider: Dr. Montey Hora (Dr. Josiah Lobo, MD) 4. OB/GYN: Dr. Clint Lipps, [facility], phone [phone], fax [phone]  REVIEW OF SYSTEMS: She has no other issues involving her other body systems.    PHYSICAL EXAM: BP 118/78   Pulse 68   Wt 236 lb 12.8 oz (107.4 kg)   BMI 36.01 kg/m    Constitutional: She is still obese, but she has lost 27 pounds since her last visit. She is alert  and fairly bright. Her affect and insight are normal. She does not look or act overtly depressed today.  Eyes: There is no arcus or proptosis. Ocular moisture appears normal. Mouth: The oropharynx appears normal. The tongue appears normal. There is normal oral moisture.  Neck: There are no bruits present. The thyroid gland appears normal in size. The thyroid gland is slightly larger at about 21 grams in size. The left lobe is slightly more enlarged, while the right lobe is normal. The consistency of the thyroid gland is normal. The thyroid gland is nontender today.    Lungs: The lungs are clear. Air movement is good. Heart: The heart rhythm and rate appear normal. Heart sounds S1 and S2 are normal. I do not appreciate any pathologic heart murmurs. Abdomen: The abdomen  is quite enlarged. Bowel sounds are normal. The abdomen is soft. Her abdomen is not tender to palpation today.    Arms: Muscle mass appears appropriate for age and gender. Hands: There is no obvious tremor. Phalangeal and metacarpophalangeal joints appear normal. Palms are normal. Legs: Muscle mass appears appropriate for age and gender. There is no edema of her shins today. Deep palpation of her shins produces no pain today.  Neurologic: Muscle strength is normal for age and gender in both the upper and the lower extremities. Muscle tone appears normal. Sensation to touch is normal in the legs.  LAB DATA:   Labs 06/08/17: HbA1c 5.3%, CBG 103   Labs 12/15/16: HbA1c 5.4%, CBG 84; TSH 0.51, free T4 1.7, free T3 2.6; 25-OH vitamin d 19  Labs 07/21/16: HbA1c 5.3%  Labs 05/30/16: TSH 1.40, free T4 1.6, free T3 2.8  Labs 12/21/15: HbA1c 5.6%; TSH 1.32, free T4 1.6, free T3 2.4  Labs 12/201/6: HbA1c 5.3%  Labs 03/15/15: HbA1c 5.4%  Labs 03/13/15: CK 105 (normal 24-173); CMP normal; iron 51; vitamin B12 589.4 (normal 211-946); PTH 28.9 (Normal 15-65); Free T4 1.64  Labs 12/26/14: HbA1c 5.6%, compared with 5.6% at last visit and 5.3%  at the prior visit.   Labs 09/22/14: 25-OH vitamin D 26.7  Labs 05/22/14: TSH 1.922, free T4 1.40, free T3 2.9; 25-hydroxy vitamin D 26  Labs 11/30/13: TSH 4.445, free T4 1.30, free T3 2.4  Labs 03/23/13: TSH 6.644, free T4 1.30, free T3 2.5; 25-hydroxy Vitamin D 27; calcium 9.4;  estradiol 145.8,   Labs 12/28/12: TSH 4.18, free T4 1.5, free T3 2.4  Labs 07/22/12: HbA1c 5.6%, TSH 0.101, free T4 1.51, free T3 2.9 (on Synthroid 137 mcg/day)  ASSESSMENT:  1. Hypothyroid: She was euthyroid again on October 2017 on her Synthroid dose of 200 mcg/day. Her TFTs in May were at the upper end of the normal range, but she did not feel hyper then and does not feel hyper now. 2. Obesity: Her weight has decreased by 27 pounds, mostly due to reducing her eating.  Quitting her job has significantly reduced  her job stress and comfort eating.  3. Goiter: Her thyroid gland is slightly larger today. The process of waxing and waning of thyroid gland size is c/w ongoing thyroiditis. 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  4. Hypertension: Her BP is a bit lower. She needs to continue to eat right, exercise, and lose weight.     5. Dyspepsia: This problem has probably improved.   6. Pre-diabetes: Her A1c is lower, paralleling her weight loss.     7. Hashimoto's disease: Her thyroiditis is clinically quiescent today.   8. Oligomenorrhea: Resolved.   9. OSA, Fatigue/snoring,apnea: She is doing much better.    10. Acanthosis, nigricans: This is about the same, indicating ongoing resistance to insulin and hyperinsulinemia due to her obesity. This problem could reverse over time if she loses enough fat weight.  12. Vitamin D deficiency disease: She resumed taking her vitamin D.   PLAN:  1. Diagnostic: HbA1c and CBG today. Repeat TFTs and 25-OH vitamin D  today.  2. Therapeutic: Continue Synthroid at 200 mcg/day; metformin, 500 mg, twice daily; Continue  Vitamin D. Try to exercise daily.  3. Patient Education: It is important to eat right and to exercise right when she can.  4. Follow-up: 4 months.  Level of Service: This visit lasted in excess of 45 minutes. More than 50% of the visit was devoted to counseling.  Molli KnockMichael Ladasha Schnackenberg, MD, CDE Adult and Pediatric Endocrinology

## 2017-06-09 LAB — CBC WITH DIFFERENTIAL/PLATELET
Basophils Absolute: 51 cells/uL (ref 0–200)
Basophils Relative: 0.8 %
Eosinophils Absolute: 211 cells/uL (ref 15–500)
Eosinophils Relative: 3.3 %
HEMATOCRIT: 45.5 % — AB (ref 35.0–45.0)
Hemoglobin: 14.9 g/dL (ref 11.7–15.5)
Lymphs Abs: 1350 cells/uL (ref 850–3900)
MCH: 28.4 pg (ref 27.0–33.0)
MCHC: 32.7 g/dL (ref 32.0–36.0)
MCV: 86.8 fL (ref 80.0–100.0)
MPV: 11.1 fL (ref 7.5–12.5)
Monocytes Relative: 8.8 %
NEUTROS PCT: 66 %
Neutro Abs: 4224 cells/uL (ref 1500–7800)
Platelets: 239 10*3/uL (ref 140–400)
RBC: 5.24 10*6/uL — ABNORMAL HIGH (ref 3.80–5.10)
RDW: 12.4 % (ref 11.0–15.0)
Total Lymphocyte: 21.1 %
WBC mixed population: 563 cells/uL (ref 200–950)
WBC: 6.4 10*3/uL (ref 3.8–10.8)

## 2017-06-09 LAB — TSH: TSH: 0.23 m[IU]/L — AB

## 2017-06-09 LAB — T3, FREE: T3, Free: 2.9 pg/mL (ref 2.3–4.2)

## 2017-06-09 LAB — VITAMIN D 25 HYDROXY (VIT D DEFICIENCY, FRACTURES): VIT D 25 HYDROXY: 27 ng/mL — AB (ref 30–100)

## 2017-06-09 LAB — T4, FREE: Free T4: 1.9 ng/dL — ABNORMAL HIGH (ref 0.8–1.8)

## 2017-06-09 LAB — IRON: Iron: 59 ug/dL (ref 40–190)

## 2017-06-10 ENCOUNTER — Encounter (INDEPENDENT_AMBULATORY_CARE_PROVIDER_SITE_OTHER): Payer: Self-pay | Admitting: *Deleted

## 2017-06-19 ENCOUNTER — Other Ambulatory Visit (INDEPENDENT_AMBULATORY_CARE_PROVIDER_SITE_OTHER): Payer: Self-pay | Admitting: "Endocrinology

## 2017-08-24 ENCOUNTER — Ambulatory Visit (INDEPENDENT_AMBULATORY_CARE_PROVIDER_SITE_OTHER): Payer: BLUE CROSS/BLUE SHIELD | Admitting: "Endocrinology

## 2017-09-24 ENCOUNTER — Ambulatory Visit (INDEPENDENT_AMBULATORY_CARE_PROVIDER_SITE_OTHER): Payer: BLUE CROSS/BLUE SHIELD | Admitting: "Endocrinology

## 2017-10-19 ENCOUNTER — Encounter (INDEPENDENT_AMBULATORY_CARE_PROVIDER_SITE_OTHER): Payer: Self-pay | Admitting: "Endocrinology

## 2017-10-19 ENCOUNTER — Ambulatory Visit (INDEPENDENT_AMBULATORY_CARE_PROVIDER_SITE_OTHER): Payer: BLUE CROSS/BLUE SHIELD | Admitting: "Endocrinology

## 2017-10-19 VITALS — BP 124/76 | HR 88 | Ht 66.73 in | Wt 248.6 lb

## 2017-10-19 DIAGNOSIS — E063 Autoimmune thyroiditis: Secondary | ICD-10-CM

## 2017-10-19 DIAGNOSIS — R7303 Prediabetes: Secondary | ICD-10-CM | POA: Diagnosis not present

## 2017-10-19 DIAGNOSIS — I10 Essential (primary) hypertension: Secondary | ICD-10-CM

## 2017-10-19 DIAGNOSIS — E559 Vitamin D deficiency, unspecified: Secondary | ICD-10-CM

## 2017-10-19 DIAGNOSIS — E049 Nontoxic goiter, unspecified: Secondary | ICD-10-CM | POA: Diagnosis not present

## 2017-10-19 DIAGNOSIS — R1013 Epigastric pain: Secondary | ICD-10-CM

## 2017-10-19 DIAGNOSIS — R718 Other abnormality of red blood cells: Secondary | ICD-10-CM

## 2017-10-19 LAB — POCT GLUCOSE (DEVICE FOR HOME USE): POC Glucose: 92 mg/dl (ref 70–99)

## 2017-10-19 LAB — POCT GLYCOSYLATED HEMOGLOBIN (HGB A1C): Hemoglobin A1C: 5.2

## 2017-10-19 NOTE — Progress Notes (Addendum)
CC: FU of acquired hypothyroidism, Hashimoto's disease, goiter, obesity, hypertension, edema, dyspepsia, pre-diabetes, hyperlipidemia, oligomenorrhea, fatigue.  HISTORY OF PRESENT ILLNESS: 47 y.o. Caucasian woman. She was accompanied by her husband.   1. Ana Murphy had her first endocrine consultation with me on or prior to 07/26/10. She had been given a diagnosis of hyperlipidemia about 25 years ago. In the past years that I've been her endocrinologist she has firmly but politely declined to take any lipid-lowering agents. She was slender until about 1998, then began gaining weight. She developed hypothyroidism secondary to Hashimoto's Thyroiditis in the 2007-2008 period. She started Synthroid in October 2008. As she has lost more thyrocytes over time we have gradually increased her Synthroid dose. She had  been taking metformin, 500 mg, twice daily for treatment of obesity and pre-diabetes, except during the time that she was pregnant and was breast-feeding in 2013-2014. As she has gained more weight she has developed obstructive sleep apnea (OSA). She now uses a C-pap machine.   2. Her last [facility] visit was on 06/08/17. I changed her Synthroid dose to 200 mcg/day for 6 days each week and 1/2 tablet on the 7th days. I continued her metformin dose of 500 mg, twice daily and her 2000 IU of vitamin D per day.   A. In the interim she returned to working at [employer] and is under more stress. She still has IBS-related diarrhea and stress-related diarrhea. She saw a GI specialist in [place] who diagnosed IBS, took her off ranitidine, then put her on IBGuard. Overall she is doing better.  B. She continues to have PT for the bulging cervical disc in her neck.    C. She thinks that her C-pap machine is working well.   D. Her sciatica comes and goes, still occurring more often on the right than on the left. Her neurologist gave her a steroid injection in her back since her last visit. The injection helped only a little bit.     E. She has not had any hot flashes since resuming regular menses..   F. Her belly hunger is better. Her head hunger is worse and she is craving and eating a lot of sweets. She has not been exercising because the exercise makes her feel hot, sticky, and sweaty.    G. She no longer has any low BG symptoms.    3. Pertinent Review of Systems:  Constitutional:  She feels "okay, but I don't want to go to work". She does not feel hyper in any way.  Eyes: Vision is good with her glasses. Her eye pressure was high at her exam in December 2016. She had a follow up exam in about May 2017. Her eye pressure was good then. There were no signs of diabetes problems. She had a follow up exam on 12/31/16 and her eye pressure was good then as well.  Neck: The patient denies any problems with her anterior neck.  Heart: The patient has had a few, brief episodes of irregular heat beats. These irregular heart beats last only a few seconds. She feels "anxiety" in her chest a "split-second" later. She had similar, but more frequent palpitations several years ago. She wore a cardiac monitor at the time, but did not have aspecific diagnosis. She does not have any complaints of chest pain or chest pressure. Gastrointestinal: Bowel movents are fairly regular, but she still has diarrhea when she gets emotionally upset. She has no complaints of acid reflux, stomach aches or pains. Arms: Her previous  arm pains due to pinching of her cervical nerves have resolved.  Legs: Muscle mass and strength seem normal. Her sciatica comes and goes, but is worse and more consistent overall. No edema is noted. Feet: There are no complaints of numbness, tingling, burning, or pain. No edema is noted. GYN: Her LMP was about 10/11/17. Periods have been regular for about the past year.    PAST MEDICAL, FAMILY, AND SOCIAL HISTORY: 1. Work and family: She has returned to work part-time at [employer]. She is having more stress and is doing more comfort eating.   2. Activities: Very little physical activity  3. Primary care provider: Dr. Montey Hora (Dr. Josiah Lobo, MD) 4. OB/GYN: Dr. Clint Lipps, [facility], phone [phone], fax [phone] 5. Neurology: Dr. Antonietta Barcelona in [facility]  REVIEW OF SYSTEMS: She has no other issues involving her other body systems.    PHYSICAL EXAM: BP 124/76   Pulse 88   Ht 5' 6.73" (1.695 m)   Wt 248 lb 9.6 oz (112.8 kg)   BMI 39.25 kg/m    Constitutional: She is still obese and has gained 12 pounds since her last visit, equivalent to an excess 220 calories per day.. She is alert, bright, and assertive. Her affect and insight are normal. She does not look or act overtly depressed today.  Eyes: There is no arcus or proptosis. Ocular moisture appears normal. Mouth: The oropharynx appears normal. The tongue appears normal. There is normal oral moisture.  Neck: There are no bruits present. The thyroid gland appears normal in size. The thyroid gland has shrunk back to essentially normal size. The consistency of the thyroid gland is normal. The thyroid gland is nontender today.    Lungs: The lungs are clear. Air movement is good. Heart: The heart rhythm and rate appear normal. Heart sounds S1 and S2 are normal. I do not appreciate any pathologic heart murmurs. Abdomen: The abdomen  is quite enlarged. Bowel sounds are normal. The abdomen is soft. Her abdomen is not tender to palpation today.    Arms: Muscle mass appears appropriate for age and gender. Hands: There is no obvious tremor. Phalangeal and metacarpophalangeal joints appear normal. Palms are normal. Legs: Muscle mass appears appropriate for age and gender. There is no edema of her shins today. Deep palpation of her shins produces no pain today.  Neurologic: Muscle strength is normal for age and gender in both the upper and the lower extremities. Muscle tone appears normal. Sensation to touch is normal in the legs.  LAB DATA:   Labs 10/19/17: HbA1c  5.2%, CBG 92  Labs 06/08/17: HbA1c 5.3%, CBG 103; TSH 0.23, free T4 1.9, free T3 2.9; 25-OH vitamin D 27; CBC normal, except RBC 5.24 (3.8-5.10) and Hct 45.5% (ref 11.7-45.5%)  Labs 12/15/16: HbA1c 5.4%, CBG 84; TSH 0.51, free T4 1.7, free T3 2.6; 25-OH vitamin D 19  Labs 07/21/16: HbA1c 5.3%  Labs 05/30/16: TSH 1.40, free T4 1.6, free T3 2.8  Labs 12/21/15: HbA1c 5.6%; TSH 1.32, free T4 1.6, free T3 2.4  Labs 12/201/6: HbA1c 5.3%  Labs 03/15/15: HbA1c 5.4%  Labs 03/13/15: CK 105 (normal 24-173); CMP normal; iron 51; vitamin B12 589.4 (normal 211-946); PTH 28.9 (Normal 15-65); Free T4 1.64  Labs 12/26/14: HbA1c 5.6%, compared with 5.6% at last visit and 5.3% at the prior visit.   Labs 09/22/14: 25-OH vitamin D 26.7  Labs 05/22/14: TSH 1.922, free T4 1.40, free T3 2.9; 25-hydroxy vitamin D 26  Labs 11/30/13: TSH  4.445, free T4 1.30, free T3 2.4  Labs 03/23/13: TSH 6.644, free T4 1.30, free T3 2.5; 25-hydroxy Vitamin D 27; calcium 9.4;  estradiol 145.8,   Labs 12/28/12: TSH 4.18, free T4 1.5, free T3 2.4  Labs 07/22/12: HbA1c 5.6%, TSH 0.101, free T4 1.51, free T3 2.9 (on Synthroid 137 mcg/day)  ASSESSMENT:  1. Hypothyroid: She was euthyroid again on October 2017 on her Synthroid dose of 200 mcg/day. Her TFTs in May 2018 were at the upper end of the normal range, but she did not feel hyper then. In October 2019 she again did not feel hyper, but her TFTs  were too high, so I reduced her synthroid dose. She needs to have follow up labs now.  2. Obesity: Her weight has increased by 12 pounds, mostly due to increased stress eating. Resuming her old job has significantly increased her job stress and comfort eating.  3. Goiter: Her thyroid gland has shrunk back to normal size today. The process of waxing and waning of thyroid gland size is c/w ongoing thyroiditis. The fact that we have needed to progressively increase her Synthroid dose over time is also evidence of progressive destruction of  thyrocytes by the T lymphocytes associated with her Hashimoto's disease.  4. Hypertension: Her DBP is a bit higher, paralleling her weight gain. She has not been eating right or exercising.      5. Dyspepsia: This problem has probably improved.   6. Pre-diabetes: Her A1c is lower, paralleling her previous weight loss.      7. Hashimoto's disease: Her thyroiditis is clinically quiescent today.   8. Oligomenorrhea: Resolved.   9. OSA, Fatigue/snoring,apnea: She is doing better.    10. Acanthosis, nigricans: This is about the same, indicating ongoing resistance to insulin and hyperinsulinemia due to her obesity. This problem could reverse over time if she loses enough fat weight.  12. Vitamin D deficiency disease: She is taking more vitamin D now. We need to re-check her vitamin D level.  13. Elevated hematocrit: She may have been somewhat dehydrated at her last lab draw, but could be trending toward polycythemia.Marland Kitchen.  PLAN:  1. Diagnostic: HbA1c and CBG today. Repeat TFTs, 25-OH vitamin D, and CBC today.  2. Therapeutic: Continue Synthroid at 200 mcg/day for 6 days each week and 1/2 tablet on the 7th days; metformin, 500 mg, twice daily; Continue  Vitamin D, 200 IU per day. Try to exercise daily.  3. Patient Education: It is important to eat right and to exercise right when she can.  4. Follow-up: 4 months.  Level of Service: This visit lasted in excess of 45 minutes. More than 50% of the visit was devoted to counseling.  Molli KnockMichael Brennan, MD, CDE Adult and Pediatric Endocrinology

## 2017-10-19 NOTE — Patient Instructions (Signed)
Follow up in 4 months 

## 2017-10-20 LAB — TSH: TSH: 0.72 m[IU]/L

## 2017-10-20 LAB — CBC WITH DIFFERENTIAL/PLATELET
BASOS PCT: 0.8 %
Basophils Absolute: 50 cells/uL (ref 0–200)
EOS ABS: 229 {cells}/uL (ref 15–500)
EOS PCT: 3.7 %
HCT: 46.7 % — ABNORMAL HIGH (ref 35.0–45.0)
HEMOGLOBIN: 15 g/dL (ref 11.7–15.5)
LYMPHS ABS: 1364 {cells}/uL (ref 850–3900)
MCH: 28.4 pg (ref 27.0–33.0)
MCHC: 32.1 g/dL (ref 32.0–36.0)
MCV: 88.3 fL (ref 80.0–100.0)
MONOS PCT: 8.2 %
MPV: 11.1 fL (ref 7.5–12.5)
NEUTROS ABS: 4049 {cells}/uL (ref 1500–7800)
Neutrophils Relative %: 65.3 %
Platelets: 215 10*3/uL (ref 140–400)
RBC: 5.29 10*6/uL — ABNORMAL HIGH (ref 3.80–5.10)
RDW: 12.5 % (ref 11.0–15.0)
Total Lymphocyte: 22 %
WBC mixed population: 508 cells/uL (ref 200–950)
WBC: 6.2 10*3/uL (ref 3.8–10.8)

## 2017-10-20 LAB — T3, FREE: T3 FREE: 2.7 pg/mL (ref 2.3–4.2)

## 2017-10-20 LAB — VITAMIN D 25 HYDROXY (VIT D DEFICIENCY, FRACTURES): Vit D, 25-Hydroxy: 21 ng/mL — ABNORMAL LOW (ref 30–100)

## 2017-10-20 LAB — T4, FREE: Free T4: 1.6 ng/dL (ref 0.8–1.8)

## 2017-10-30 ENCOUNTER — Telehealth (INDEPENDENT_AMBULATORY_CARE_PROVIDER_SITE_OTHER): Payer: Self-pay

## 2017-10-30 NOTE — Telephone Encounter (Signed)
-----   Message from David StallMichael J Brennan, MD sent at 10/29/2017 11:24 PM EDT ----- Thyroid tests were normal, in the upper 25% of the normal range. Vitamin D level was low at 21. Red blood cells and hematocrit are slightly more elevated.

## 2017-12-16 ENCOUNTER — Other Ambulatory Visit: Payer: Self-pay | Admitting: "Endocrinology

## 2018-02-18 ENCOUNTER — Ambulatory Visit (INDEPENDENT_AMBULATORY_CARE_PROVIDER_SITE_OTHER): Payer: BLUE CROSS/BLUE SHIELD | Admitting: "Endocrinology

## 2018-03-25 ENCOUNTER — Ambulatory Visit (INDEPENDENT_AMBULATORY_CARE_PROVIDER_SITE_OTHER): Payer: BLUE CROSS/BLUE SHIELD | Admitting: "Endocrinology

## 2018-03-25 ENCOUNTER — Encounter (INDEPENDENT_AMBULATORY_CARE_PROVIDER_SITE_OTHER): Payer: Self-pay | Admitting: "Endocrinology

## 2018-03-25 VITALS — BP 122/76 | HR 90 | Ht 67.4 in | Wt 265.6 lb

## 2018-03-25 DIAGNOSIS — E049 Nontoxic goiter, unspecified: Secondary | ICD-10-CM

## 2018-03-25 DIAGNOSIS — E559 Vitamin D deficiency, unspecified: Secondary | ICD-10-CM

## 2018-03-25 DIAGNOSIS — I10 Essential (primary) hypertension: Secondary | ICD-10-CM | POA: Diagnosis not present

## 2018-03-25 DIAGNOSIS — R7303 Prediabetes: Secondary | ICD-10-CM | POA: Diagnosis not present

## 2018-03-25 DIAGNOSIS — R531 Weakness: Secondary | ICD-10-CM

## 2018-03-25 DIAGNOSIS — E063 Autoimmune thyroiditis: Secondary | ICD-10-CM | POA: Diagnosis not present

## 2018-03-25 DIAGNOSIS — R718 Other abnormality of red blood cells: Secondary | ICD-10-CM

## 2018-03-25 DIAGNOSIS — R5383 Other fatigue: Secondary | ICD-10-CM

## 2018-03-25 LAB — POCT GLUCOSE (DEVICE FOR HOME USE): POC GLUCOSE: 94 mg/dL (ref 70–99)

## 2018-03-25 LAB — POCT GLYCOSYLATED HEMOGLOBIN (HGB A1C): HEMOGLOBIN A1C: 5.8 % — AB (ref 4.0–5.6)

## 2018-03-25 NOTE — Patient Instructions (Signed)
Follow up visit in 4 months.  

## 2018-03-25 NOTE — Progress Notes (Signed)
CC: FU of acquired hypothyroidism, Hashimoto's disease, goiter, obesity, hypertension, edema, dyspepsia, pre-diabetes, hyperlipidemia, oligomenorrhea, fatigue.  HISTORY OF PRESENT ILLNESS: 47 y.o. Caucasian woman. She was accompanied by her husband.   1. Ana Murphy had her first endocrine consultation with me on or prior to 07/26/10. She had been given a diagnosis of hyperlipidemia about 25 years prior. In the past years that I've been her endocrinologist she has firmly but politely declined to take any lipid-lowering agents. She was slender until about 1998, then began gaining weight. She developed hypothyroidism secondary to Hashimoto's Thyroiditis in the 2007-2008 period. She started Synthroid in October 2008. As she has lost more thyrocytes over time we have gradually increased her Synthroid dose. She had  been taking metformin, 500 mg, twice daily for treatment of obesity and pre-diabetes, except during the time that she was pregnant and was breast-feeding in 2013-2014. As she has gained more weight she has developed obstructive sleep apnea (OSA). She now uses a C-pap machine.   2. Her last [facility] visit was on 10/19/17. I changed her Synthroid dose to 200 mcg/day for 6 days each week and 1/2 tablet on the 7th days. I continued her metformin dose of 500 mg, twice daily and her 2000 IU of vitamin D per day.   A. In the interim she no longer works [employer]. Her stress-related diarrhea resolved. She has not needed IBGuard in the past month. .  B. She no longer receives PT for the bulging cervical disc in her neck.    C. She thinks that her C-pap machine is working well.   D. Her right leg sciatica is much worse. Her neurologist gave her three steroid injection in her back since her last visit, one of which was an epidural injection that only helped "a little bit".  She is due for a second epidural injection in September. The neurologist offered to refer her for a neurosurgical evaluation, but she declined. She does not  want to have surgery unless it is the last resort.  E. She has had more hot flashes, despite resuming regular menses. She took antacids for 15 days. She stopped metformin in the mornings due to concerns that she might be hypoglycemic,  But continued the evening dose of metformin.  She still feels drained, tired, and light-headed.     3. Pertinent Review of Systems:  Constitutional:  She feels "very tired". She does not feel hyper in any way.  Eyes: Vision is good with her glasses. Her eye pressure was high at her exam in December 2016. She had a follow up exam in about May 2017. Her eye pressure was good then. There were no signs of diabetes problems. She had a follow up exam on 01/01/18. Her eye pressure was "a little high, but not enough to treat". She does take drops for her dry eyes.   Neck: The patient denies any problems with her anterior neck.  Heart: The patient has had a few, brief episodes of irregular heat beats. These irregular heart beats last only a few seconds. She had similar, but more frequent palpitations several years ago. She wore a cardiac monitor at the time, but did not have aspecific diagnosis. She does not have any complaints of chest pain or chest pressure. She does not consume much caffeine.  Gastrointestinal: Bowel movents are fairly regular. She has no complaints of acid reflux, stomach aches or pains. She did have some stomach pains three weeks ago after a steroid injection, so she took antacids.  Arms: Her previous arm pains due to pinching of her cervical nerves have resolved.  Legs: As above. Her sciatica is worse. Muscle mass and strength seem normal. No edema is noted. Feet: There are no complaints of numbness, tingling, burning, or pain. No edema is noted. GYN: Her LMP began on 03/07/18. Periods have been regular for about the past year.   Back: She also has pains in her mid-back that radiate down and anteriorly across the abdomen bilaterally in a dermatomal pattern.    PAST MEDICAL, FAMILY, AND SOCIAL HISTORY: 1. Work and family: She is now unemployed. 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 2. Activities: Very little physical activity  3. Primary care provider: Dr. Montey HoraKathleen Rice (Dr. Josiah LoboJohn McFadden, MD) 4. OB/GYN: Dr. Clint LippsKalpen Patel, [facility], phone [phone], fax [phone] 5. Neurology: Dr. Antoniet[facility]onuzi [facility]  REVIEW OF SYSTEMS: She has no other issues involving her other body systems.    PHYSICAL EXAM: BP 122/76   Pulse 90   Ht 5' 7.4" (1.712 m)   Wt 265 lb 9.6 oz (120.5 kg)   BMI 41.10 kg/m    Constitutional: She is more obese after gaining 17 pounds since her last visit. She is alert and bright. Her affect and insight are normal. She does not look or act overtly depressed today.  Eyes: There is no arcus or proptosis. Ocular moisture appears normal. Mouth: The oropharynx appears normal, except that her posterior pharyngeal opening is somewhat narrowed by fatty tissue. The tongue appears normal. There is normal oral moisture.  Neck: There are no bruits present. The thyroid gland appears normal in size. The thyroid gland has shrunk back to essentially normal size. The consistency of the thyroid gland is normal. The thyroid gland is nontender today.    Lungs: The lungs are clear. Air movement is good. Heart: The heart rhythm and rate appear normal. Heart sounds S1 and S2 are normal. I do not appreciate any pathologic heart murmurs. Abdomen: The abdomen  is quite enlarged. Bowel sounds are normal. The abdomen is soft. Her abdomen is not tender to palpation today.    Arms: Muscle mass appears appropriate for age and gender. Hands: There is no obvious tremor. Phalangeal and metacarpophalangeal joints appear normal. Palms are normal. Legs: Muscle mass appears appropriate for age and gender. There is no edema of her shins today. Deep palpation of her shins produces no pain today.  Neurologic: Muscle strength is normal for age and gender in both the  upper and the lower extremities. Muscle tone appears normal. Sensation to touch is normal in the legs.  LAB DATA:   Labs 03/25/18: HbA1c 5.8%, CBG 94  Labs 10/19/17: HbA1c 5.2%, CBG 92  Labs 06/08/17: HbA1c 5.3%, CBG 103; TSH 0.23, free T4 1.9, free T3 2.9; 25-OH vitamin D 27; CBC normal, except RBC 5.24 (3.8-5.10) and Hct 45.5% (ref 11.7-45.5%)  Labs 12/15/16: HbA1c 5.4%, CBG 84; TSH 0.51, free T4 1.7, free T3 2.6; 25-OH vitamin D 19  Labs 07/21/16: HbA1c 5.3%  Labs 05/30/16: TSH 1.40, free T4 1.6, free T3 2.8  Labs 12/21/15: HbA1c 5.6%; TSH 1.32, free T4 1.6, free T3 2.4  Labs 12/201/6: HbA1c 5.3%  Labs 03/15/15: HbA1c 5.4%  Labs 03/13/15: CK 105 (normal 24-173); CMP normal; iron 51; vitamin B12 589.4 (normal 211-946); PTH 28.9 (Normal 15-65); Free T4 1.64  Labs 12/26/14: HbA1c 5.6%, compared with 5.6% at last visit and 5.3% at the prior visit.   Labs 09/22/14: 25-OH vitamin D 26.7  Labs 05/22/14:  TSH 1.922, free T4 1.40, free T3 2.9; 25-hydroxy vitamin D 26  Labs 11/30/13: TSH 4.445, free T4 1.30, free T3 2.4  Labs 03/23/13: TSH 6.644, free T4 1.30, free T3 2.5; 25-hydroxy Vitamin D 27; calcium 9.4;  estradiol 145.8,   Labs 12/28/12: TSH 4.18, free T4 1.5, free T3 2.4  Labs 07/22/12: HbA1c 5.6%, TSH 0.101, free T4 1.51, free T3 2.9 (on Synthroid 137 mcg/day)  ASSESSMENT:  1. Hypothyroid: She was euthyroid again on October 2017 on her Synthroid dose of 200 mcg/day. Her TFTs in May 2018 were at the upper end of the normal range, but she did not feel hyper then. In October 2019 she again did not feel hyper, but her TFTs  were too high, so I reduced her synthroid dose. She needs to have follow up labs now.  2. Obesity: Her weight has increased by 17 pounds, mostly due to increased eating after steroid injections.  3. Goiter: Her thyroid gland has shrunk back to normal size today. The process of waxing and waning of thyroid gland size is c/w ongoing thyroiditis. 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  4. Hypertension: Her DBP is still a bit high, paralleling her weight gain. She has not been eating right or exercising.      5. Dyspepsia: This problem varies with her steroid injections.    6. Pre-diabetes: Her A1c is much higher, paralleling her steroid injections, carb intake, her weight gain, and her decision to arbitrarily reduce her metformin doses without any real clinical justification.       7. Hashimoto's disease: Her thyroiditis is clinically quiescent today.   8. Oligomenorrhea: Resolved.   9. OSA, Fatigue/snoring,apnea: She is doing better.    10. Acanthosis, nigricans: This is about the same, indicating ongoing resistance to insulin and hyperinsulinemia due to her obesity. This problem could reverse over time if she loses enough fat weight.  12. Vitamin D deficiency disease: She is taking more vitamin D now. We need to re-check her vitamin D level.  13. Elevated hematocrit: She may have been somewhat dehydrated at her last lab draw, but could be trending toward polycythemia.. 14. Weakness: She may have electrolyte problems resulting from the mineralocorticoid effects of her steroid injections.    PLAN:  1. Diagnostic: HbA1c and CBG today. Repeat TFTs, 25-OH vitamin D, CMP, and CBC today.  2. Therapeutic: Continue Synthroid at 200 mcg/day for 6 days each week and 1/2 tablet on the 7th days; Resume metformin, 500 mg, twice daily; Continue  Vitamin D, 2000 IU per day. Try to exercise daily.  3. Patient Education: It is important to eat right and to exercise right when she can.  4. Follow-up: 4 months.  Level of Service: This visit lasted in excess of 55 minutes. More than 50% of the visit was devoted to counseling.  Molli Knock, MD, CDE Adult and Pediatric Endocrinology

## 2018-03-26 LAB — COMPREHENSIVE METABOLIC PANEL
AG RATIO: 1.3 (calc) (ref 1.0–2.5)
ALT: 25 U/L (ref 6–29)
AST: 18 U/L (ref 10–35)
Albumin: 4.1 g/dL (ref 3.6–5.1)
Alkaline phosphatase (APISO): 53 U/L (ref 33–115)
BILIRUBIN TOTAL: 0.3 mg/dL (ref 0.2–1.2)
BUN: 12 mg/dL (ref 7–25)
CALCIUM: 9.4 mg/dL (ref 8.6–10.2)
CO2: 27 mmol/L (ref 20–32)
Chloride: 104 mmol/L (ref 98–110)
Creat: 0.85 mg/dL (ref 0.50–1.10)
Globulin: 3.2 g/dL (calc) (ref 1.9–3.7)
Glucose, Bld: 86 mg/dL (ref 65–99)
Potassium: 4.2 mmol/L (ref 3.5–5.3)
Sodium: 137 mmol/L (ref 135–146)
Total Protein: 7.3 g/dL (ref 6.1–8.1)

## 2018-03-26 LAB — CBC WITH DIFFERENTIAL/PLATELET
BASOS ABS: 57 {cells}/uL (ref 0–200)
Basophils Relative: 0.8 %
EOS PCT: 2.8 %
Eosinophils Absolute: 199 cells/uL (ref 15–500)
HEMATOCRIT: 45.2 % — AB (ref 35.0–45.0)
Hemoglobin: 14.8 g/dL (ref 11.7–15.5)
LYMPHS ABS: 1782 {cells}/uL (ref 850–3900)
MCH: 29.3 pg (ref 27.0–33.0)
MCHC: 32.7 g/dL (ref 32.0–36.0)
MCV: 89.5 fL (ref 80.0–100.0)
MPV: 10.9 fL (ref 7.5–12.5)
Monocytes Relative: 10 %
NEUTROS ABS: 4352 {cells}/uL (ref 1500–7800)
Neutrophils Relative %: 61.3 %
Platelets: 228 10*3/uL (ref 140–400)
RBC: 5.05 10*6/uL (ref 3.80–5.10)
RDW: 12.3 % (ref 11.0–15.0)
Total Lymphocyte: 25.1 %
WBC: 7.1 10*3/uL (ref 3.8–10.8)
WBCMIX: 710 {cells}/uL (ref 200–950)

## 2018-03-26 LAB — IRON: IRON: 67 ug/dL (ref 40–190)

## 2018-03-26 LAB — T4, FREE: Free T4: 1.4 ng/dL (ref 0.8–1.8)

## 2018-03-26 LAB — TSH: TSH: 0.88 mIU/L

## 2018-03-26 LAB — T3, FREE: T3 FREE: 2.4 pg/mL (ref 2.3–4.2)

## 2018-03-26 LAB — VITAMIN D 25 HYDROXY (VIT D DEFICIENCY, FRACTURES): Vit D, 25-Hydroxy: 26 ng/mL — ABNORMAL LOW (ref 30–100)

## 2018-04-01 ENCOUNTER — Encounter (INDEPENDENT_AMBULATORY_CARE_PROVIDER_SITE_OTHER): Payer: Self-pay | Admitting: *Deleted

## 2018-04-01 ENCOUNTER — Telehealth (INDEPENDENT_AMBULATORY_CARE_PROVIDER_SITE_OTHER): Payer: Self-pay | Admitting: "Endocrinology

## 2018-04-01 NOTE — Telephone Encounter (Signed)
°  Who's calling (name and relationship to patient) : [name], Ana Murphy (Self)  Best contact number: [phone] (W)  Provider they see: Fransico MichaelBrennan  Reason for call: Patient called requesting test results. She states if she does not answer to please leave results on voicemail

## 2018-04-01 NOTE — Telephone Encounter (Signed)
mychart message sent with lab results.

## 2018-04-08 ENCOUNTER — Encounter (INDEPENDENT_AMBULATORY_CARE_PROVIDER_SITE_OTHER): Payer: Self-pay | Admitting: *Deleted

## 2018-04-08 NOTE — Telephone Encounter (Signed)
Patient stated that a detailed vm can be left on her phone if she is unreachable. She would also like whomever will be calling her and/or leaving a vm to speak slowly and clearly as [place] is not her primary language.

## 2018-04-08 NOTE — Telephone Encounter (Signed)
LVM, advised that per Dr. Fransico MichaelBrennan, Thyroid tests are normal, at about the 75% of the normal range. CMP is normal. Vitamin D is still low, but better. CBC was normal, but the hematocrit was slightly elevated, perhaps due to some dehydration. Iron was normal.

## 2018-04-08 NOTE — Telephone Encounter (Signed)
Patient would like to discuss lab results with someone from clinic verbally. She stated she was having trouble with Mychart. The questions about MyChart that she had were beyond my scope. I gave her the help number to Mychart.

## 2018-06-19 ENCOUNTER — Other Ambulatory Visit: Payer: Self-pay | Admitting: "Endocrinology

## 2018-06-25 ENCOUNTER — Other Ambulatory Visit (INDEPENDENT_AMBULATORY_CARE_PROVIDER_SITE_OTHER): Payer: Self-pay | Admitting: "Endocrinology

## 2018-07-26 ENCOUNTER — Encounter (INDEPENDENT_AMBULATORY_CARE_PROVIDER_SITE_OTHER): Payer: Self-pay | Admitting: "Endocrinology

## 2018-07-26 ENCOUNTER — Ambulatory Visit (INDEPENDENT_AMBULATORY_CARE_PROVIDER_SITE_OTHER): Payer: BLUE CROSS/BLUE SHIELD | Admitting: "Endocrinology

## 2018-07-26 VITALS — BP 122/76 | HR 84 | Ht 66.93 in | Wt 261.8 lb

## 2018-07-26 DIAGNOSIS — I10 Essential (primary) hypertension: Secondary | ICD-10-CM

## 2018-07-26 DIAGNOSIS — E049 Nontoxic goiter, unspecified: Secondary | ICD-10-CM | POA: Diagnosis not present

## 2018-07-26 DIAGNOSIS — R5383 Other fatigue: Secondary | ICD-10-CM

## 2018-07-26 DIAGNOSIS — R7303 Prediabetes: Secondary | ICD-10-CM

## 2018-07-26 DIAGNOSIS — E063 Autoimmune thyroiditis: Secondary | ICD-10-CM

## 2018-07-26 DIAGNOSIS — G542 Cervical root disorders, not elsewhere classified: Secondary | ICD-10-CM

## 2018-07-26 DIAGNOSIS — R718 Other abnormality of red blood cells: Secondary | ICD-10-CM

## 2018-07-26 LAB — POCT GLUCOSE (DEVICE FOR HOME USE): POC Glucose: 84 mg/dl (ref 70–99)

## 2018-07-26 LAB — POCT GLYCOSYLATED HEMOGLOBIN (HGB A1C): Hemoglobin A1C: 5.5 % (ref 4.0–5.6)

## 2018-07-26 NOTE — Progress Notes (Signed)
CC: FU of acquired hypothyroidism, Hashimoto's disease, goiter, obesity, hypertension, edema, dyspepsia, pre-diabetes, hyperlipidemia, oligomenorrhea, fatigue.  HISTORY OF PRESENT ILLNESS: 47 y.o. Caucasian woman. She was accompanied by her husband.   1. Ana Murphy had her first endocrine consultation with me on or prior to 07/26/10. She had been given a diagnosis of hyperlipidemia about 25 years prior. In the past years that I've been her endocrinologist she has firmly but politely declined to take any lipid-lowering agents. She was slender until about 1998, then began gaining weight. She developed hypothyroidism secondary to Hashimoto's Thyroiditis in the 2007-2008 period. She started Synthroid in October 2008. As she has lost more thyrocytes over time we have gradually increased her Synthroid dose. She had  been taking metformin, 500 mg, twice daily for treatment of obesity and pre-diabetes, except during the time that she was pregnant and was breast-feeding in 2013-2014. As she has gained more weight she has developed obstructive sleep apnea (OSA). She now uses a C-pap machine.   2. Her last [facility] visit was on 03/25/18. I continued her Synthroid doses of 200 mcg/day for 6 days each week and 1/2 tablet on the 7th days. I asked her to resume her metformin dose of 500 mg, twice daily and her 2000 IU of vitamin D per day.   A. In the interim she resumed working at [employer]  for about 25-28 hours per week.   B. Her stress-related diarrhea resolved. She has not needed IBGuard for many months.   C. Her hands are getting numb again, so she will resume PT soon.     D. She thinks that her C-pap machine is working well.   E. Her right leg sciatica is better, but still bothers her a lot. Her neurologist gave her two steroid injection in her back prior to her last visit, one of which was an epidural injection that only helped "a little bit".  She had a third epidural injection in September that gave her some relief. Her  neurologist offered to refer her for a neurosurgical evaluation, but she declined. She does not want to have surgery unless it is the last resort.  F. She has not had any more daytime hot flashes, but still has some at night. She no longer feels drained, tired, and light-headed.   G. She is now taking Pristiq, Synthroid, metformin, and vitamin D.   H. She thinks that her appetite decreased after starting Pristiq. She remains strongly opposed to exercise. "I listen to my body and don't do anything that my body tells me not to do."    3. Pertinent Review of Systems:  Constitutional:  She feels "better". Her energy level is "OK".   Eyes: Vision is good with her glasses. Her eye pressure was high at her exam in December 2016. She had a follow up exam in about May 2017. Her eye pressure was good then. There were no signs of diabetes problems. She had a follow up exam on 01/01/18. Her eye pressure was "a little high, but not enough to treat". She takes drops for her dry eyes.   Neck: The patient denies any problems with her anterior neck.  Heart: The patient has not had any palpitations since her last visit. The previous irregular heart beats lasted only a few seconds. She had similar, but more frequent palpitations several years ago. She wore a cardiac monitor at the time, but did not have a specific diagnosis. She does not have any complaints of chest pain or chest  pressure. She does not consume much caffeine.  Gastrointestinal: Bowel movents are fairly regular. She has no complaints of acid reflux, stomach aches or pains.  Arms: Her previous arm pains due to pinching of her cervical nerves have resolved.  Hands: She has had some recurrent hand numbness during the night.  Legs: As above. Her sciatica is better. Muscle mass and strength seem normal. No edema is noted. Feet: There are no complaints of numbness, tingling, burning, or pain. No edema is noted. GYN: Her LMP began on 06/29/18. Periods have been  regular for about the past year.   Back: She no longer has the pains in her mid-back that radiate down and anteriorly across the abdomen bilaterally in a dermatomal pattern.   PAST MEDICAL, FAMILY, AND SOCIAL HISTORY: 1. Work and family: She is now working part-time at [employer] again. 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 Her family is visiting for three weeks. Her insurance through her husband's job only covers the [employer].  2. Activities: Very little physical activity  3. Primary care provider: Dr. Montey Hora (Dr. Josiah Lobo, MD) 4. OB/GYN: Dr. Clint Lipps, [facility], phone [phone], fax [phone] 5. Neurology: Dr. Antonietta Barcelona in [facility]  REVIEW OF SYSTEMS: She has no other issues involving her other body systems.    PHYSICAL EXAM: BP 122/76   Pulse 84   Ht 5' 6.93" (1.7 m)   Wt 261 lb 12.8 oz (118.8 kg)   LMP 06/29/2018   BMI 41.09 kg/m    Constitutional: She is less obese after losing 4  pounds since her last visit. She is alert, bright, and upbeat today, the best that she has looked in at least the past year. Her affect and insight are normal. She seems quite happy today.  Eyes: There is no arcus or proptosis. Ocular moisture appears normal. Mouth: The oropharynx appears normal, except that her posterior pharyngeal opening is somewhat narrowed by fatty tissue. The tongue appears normal. There is normal oral moisture.  Neck: There are no bruits present. The thyroid gland appears normal in size. The thyroid gland has shrunk back to normal size. The consistency of the thyroid gland is normal. The thyroid gland is nontender today.    Lungs: The lungs are clear. Air movement is good. Heart: The heart rhythm and rate appear normal. Heart sounds S1 and S2 are normal. I do not appreciate any pathologic heart murmurs. Abdomen: The abdomen  is quite enlarged. Bowel sounds are normal. The abdomen is soft. Her abdomen is not tender to palpation today.    Arms: Muscle mass  appears appropriate for age and gender. Hands: There is no obvious tremor. Phalangeal and metacarpophalangeal joints appear normal. Palms are normal. Legs: Muscle mass appears appropriate for age and gender. There is no edema of her shins today. Deep palpation of her shins produces no pain today.  Neurologic: Muscle strength is normal for age and gender in both the upper and the lower extremities. Muscle tone appears normal. Sensation to touch is normal in the legs.  LAB DATA:   Labs 07/26/18: HbA1c 5.5%, CBG 84  Labs 03/25/18: HbA1c 5.8%, CBG 94; TSH 0.88, free T4 1.4, free T3 2.4; CMP normal; vitamin D 26  Labs 10/19/17: HbA1c 5.2%, CBG 92  Labs 06/08/17: HbA1c 5.3%, CBG 103; TSH 0.23, free T4 1.9, free T3 2.9; 25-OH vitamin D 27; CBC normal, except RBC 5.24 (3.8-5.10) and Hct 45.5% (ref 11.7-45.5%)  Labs 12/15/16: HbA1c 5.4%, CBG 84; TSH 0.51, free T4  1.7, free T3 2.6; 25-OH vitamin D 19  Labs 07/21/16: HbA1c 5.3%  Labs 05/30/16: TSH 1.40, free T4 1.6, free T3 2.8  Labs 12/21/15: HbA1c 5.6%; TSH 1.32, free T4 1.6, free T3 2.4  Labs 12/201/6: HbA1c 5.3%  Labs 03/15/15: HbA1c 5.4%  Labs 03/13/15: CK 105 (normal 24-173); CMP normal; iron 51; vitamin B12 589.4 (normal 211-946); PTH 28.9 (Normal 15-65); Free T4 1.64  Labs 12/26/14: HbA1c 5.6%, compared with 5.6% at last visit and 5.3% at the prior visit.   Labs 09/22/14: 25-OH vitamin D 26.7  Labs 05/22/14: TSH 1.922, free T4 1.40, free T3 2.9; 25-hydroxy vitamin D 26  Labs 11/30/13: TSH 4.445, free T4 1.30, free T3 2.4  Labs 03/23/13: TSH 6.644, free T4 1.30, free T3 2.5; 25-hydroxy Vitamin D 27; calcium 9.4;  estradiol 145.8,   Labs 12/28/12: TSH 4.18, free T4 1.5, free T3 2.4  Labs 07/22/12: HbA1c 5.6%, TSH 0.101, free T4 1.51, free T3 2.9 (on Synthroid 137 mcg/day)  ASSESSMENT:  1. Hypothyroid: The patient's TFTs have fluctuated over time, so we have adjusted her Synthroid doses accordingly. She was euthyroid in August and is  clinically euthyroid today. We will repeat her lab tests prior to her next visit.  2. Obesity: Her weight has decreased by 4 pounds, mostly due to decreased eating after starting Pristiq. .  3. Goiter: Her thyroid gland has shrunk back to normal size today. The process of waxing and waning of thyroid gland size is c/w ongoing thyroiditis. 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  4. Hypertension: Her DBP is still a bit high. She needs to walk daily.       5. Dyspepsia: This problem varies with her steroid injections.    6. Pre-diabetes: Her A1c is much lower, now within normal limits, paralleling her weight loss.        7. Hashimoto's disease: Her thyroiditis is clinically quiescent today.   8. Oligomenorrhea: Resolved.   9. OSA, Fatigue/snoring,apnea: She is doing better with her C-pap machine.    10. Acanthosis, nigricans: This is about the same, indicating ongoing resistance to insulin and hyperinsulinemia due to her obesity. This problem could reverse over time if she loses enough fat weight.  12. Vitamin D deficiency disease: She is taking more vitamin D now. We need to re-check her vitamin D level.  13. Elevated hematocrit: She may have been somewhat dehydrated at her last lab draw, but could be trending toward polycythemia.. 14. Weakness: This problem has improved.  15. Cervical neuropathy: This problem has recurred, but mildly. PT and traction may help.     PLAN:  1. Diagnostic: HbA1c and CBG today. Fax the results from her lab tests that will be done for Dr. Dimple Casey next week to me, so that we do not order excessive lab work. Repeat TFTs, 25-OH vitamin D, CMP, and CBC prior to next visit if not ordered by Dr. Dimple Casey.   2. Therapeutic: Continue Synthroid at 200 mcg/day for 6 days each week and 1/2 tablet on the 7th days; Resume  metformin, 500 mg, twice daily; Continue  Vitamin D, 2000 IU per day. Try to exercise daily.  3. Patient Education: It is important to eat right and to exercise right when she can.  4. Follow-up: 4 months.  Level of Service: This visit lasted in excess of 50  minutes.  More than 50% of the visit was devoted to counseling.  Molli Knock, MD, CDE Adult and Pediatric Endocrinology

## 2018-07-26 NOTE — Patient Instructions (Signed)
Follow up visit in 4 months.  

## 2018-12-20 ENCOUNTER — Ambulatory Visit (INDEPENDENT_AMBULATORY_CARE_PROVIDER_SITE_OTHER): Payer: BLUE CROSS/BLUE SHIELD | Admitting: "Endocrinology

## 2019-01-18 ENCOUNTER — Other Ambulatory Visit: Payer: Self-pay | Admitting: "Endocrinology

## 2019-02-06 NOTE — Progress Notes (Signed)
CC: FU of acquired hypothyroidism, Hashimoto's disease, goiter, obesity, hypertension, edema, dyspepsia, pre-diabetes, hyperlipidemia, oligomenorrhea, fatigue.  HISTORY OF PRESENT ILLNESS: 48 y.o. Caucasian woman. She was accompanied by her husband.   1. Ana Murphy had her first endocrine consultation with me on or prior to 07/26/10. She had been given a diagnosis of hyperlipidemia about 25 years prior. In the past years that I've been her endocrinologist she has firmly but politely declined to take any lipid-lowering agents. She was slender until about 1998, then began gaining weight. She developed hypothyroidism secondary to Hashimoto's Thyroiditis in the 2007-2008 period. She started Synthroid in October 2008. As she has lost more thyrocytes over time we have gradually increased her Synthroid dose. She had  been taking metformin, 500 mg, twice daily for treatment of obesity and pre-diabetes, except during the time that she was pregnant and was breast-feeding in 2013-2014. As she has gained more weight she has developed obstructive sleep apnea (OSA). She now uses a C-pap machine. [Addendum 02/07/19: Her mother went through menopause at age 95.]  2. Her last [facility] visit was on 07/26/18. I continued her Synthroid doses of 200 mcg/day for 6 days each week and 1/2 tablet on the 7th days. I asked her to resume her metformin dose of 500 mg, twice daily and her 2000 IU of vitamin D per day.   A. In the interim she had resumed working at [employer]  for about 25-28 hours per week, but has since then has been laid off. She may go back to work in August.   B. She continues to have pains in her posterior neck and numbness in her hands. When she was in PT she was doing well, but when PT was stopped due to the covid-19 virus she re-developed pains in her neck and numbness in her hands    C. Her stress-related diarrhea occasionally recurs. She no longer takes the IBGuard.   D. She thinks that her C-pap machine is working well.   E.  Her right leg sciatica comes and goes. Her left leg sciatica also comes and goes. Her neurologist gave her three steroid injection in her back prior to her last visit. Her neurologist offered to refer her for a neurosurgical evaluation, but she declined. She does not want to have surgery unless it is the last resort.  F. She has had a recurrence of daytime hot flashes, but not often at night.    G. She is now taking Pristiq, Synthroid, metformin, and vitamin D.   H. She says that her appetite increased soon after being laid off at work. She remains strongly opposed to exercise because it causes pain.     3. Pertinent Review of Systems:  Constitutional:  She feels "okay". Her energy level is "low". The staying at home for Covid-19 is getting bothersome. Her sinus allergies are acting up.  Eyes: Vision is good with her glasses. Her eye pressure was high at her exam in December 2016. She had a follow up exam in about May 2017. Her eye pressure was good then. There were no signs of diabetes problems. She had a follow up exam on 01/01/18. Her eye pressure was "a little high, but not enough to treat". She takes drops for her dry eyes. She will have follow up exam on 02/24/19.  Neck: The patient denies any problems with her anterior neck.  Heart: The patient has not had any palpitations since her last visit. The previous irregular heart beats lasted only a few  seconds. She had similar, but more frequent palpitations several years ago. She wore a cardiac monitor at the time, but did not have a specific diagnosis. She does not have any complaints of chest pain or chest pressure. She does not consume much caffeine.  Gastrointestinal: Bowel movents are fairly regular. She has no complaints of acid reflux, stomach aches or pains.  Arms: Her previous arm pains due to pinching of her cervical nerves have resolved.  Hands: She has had some recurrent hand numbness during the nights and days.   Legs: As above. Her  sciatica is worse. Muscle mass and strength seem normal. No edema is noted. Feet: There are no complaints of numbness, tingling, burning, or pain. No edema is noted. GYN: Her LMP began on 01/31/19. Periods have been regular for about the past 18 months.   Back: She still sometimes has pains in her mid-back that radiate down and anteriorly across the abdomen bilaterally in a dermatomal pattern.   PAST MEDICAL, FAMILY, AND SOCIAL HISTORY: 1. Work and family: She has been laid off from her job at [employer]. She may return to work in August. again. 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 Her insurance through her husband's job only covers the [employer].  2. Activities: Very little physical activity  3. Primary care provider: Dr. Addison Naegeli (Dr. Blenda Mounts, MD) 4. OB/GYN: Dr. Odella Aquas, [facility], phone [phone], fax [phone] 5. Neurology: Dr. Everette Rank in [place]: She has no other issues involving her other body systems.    PHYSICAL EXAM: BP 122/72   Pulse 88   Wt 269 lb 3.2 oz (122.1 kg)   BMI 42.25 kg/m    Constitutional: She has regained 8 pounds since December 2019. She is alert, bright, and upbeat today.  Her affect and insight are normal. She seems quite happy today.  Eyes: There is no arcus or proptosis. Ocular moisture appears normal. Mouth: The oropharynx appears normal, except that her posterior pharyngeal opening is somewhat narrowed by fatty tissue. The tongue appears normal. There is normal oral moisture.  Neck: There are no bruits present. The thyroid gland appears normal in size. The thyroid gland has shrunk back to normal size. The consistency of the thyroid gland is normal. The thyroid gland is nontender today.    Lungs: The lungs are clear. Air movement is good. Heart: The heart rhythm and rate appear normal. Heart sounds S1 and S2 are normal. I do not appreciate any pathologic heart murmurs. Abdomen: The abdomen  is quite  enlarged. Bowel sounds are normal. The abdomen is soft. Her abdomen is not tender to palpation today.    Arms: Muscle mass appears appropriate for age and gender. Hands: There is no obvious tremor. Phalangeal and metacarpophalangeal joints appear normal. Palms are normal. Legs: Muscle mass appears appropriate for age and gender. There is no edema of her shins today. Deep palpation of her shins produces no pain today.  Neurologic: Muscle strength is normal for age and gender in both the upper and the lower extremities. Muscle tone appears normal. Sensation to touch is normal in the legs.  LAB DATA:   Labs 02/07/19: HbA1c 5.7%, CBG 86  Labs 07/26/18: HbA1c 5.5%, CBG 84  Labs 03/25/18: HbA1c 5.8%, CBG 94; TSH 0.88, free T4 1.4, free T3 2.4; CMP normal; vitamin D 26  Labs 10/19/17: HbA1c 5.2%, CBG 92  Labs 06/08/17: HbA1c 5.3%, CBG 103; TSH 0.23, free T4 1.9, free T3 2.9; 25-OH  vitamin D 27; CBC normal, except RBC 5.24 (3.8-5.10) and Hct 45.5% (ref 11.7-45.5%)  Labs 12/15/16: HbA1c 5.4%, CBG 84; TSH 0.51, free T4 1.7, free T3 2.6; 25-OH vitamin D 19  Labs 07/21/16: HbA1c 5.3%  Labs 05/30/16: TSH 1.40, free T4 1.6, free T3 2.8  Labs 12/21/15: HbA1c 5.6%; TSH 1.32, free T4 1.6, free T3 2.4  Labs 12/201/6: HbA1c 5.3%  Labs 03/15/15: HbA1c 5.4%  Labs 03/13/15: CK 105 (normal 24-173); CMP normal; iron 51; vitamin B12 589.4 (normal 211-946); PTH 28.9 (Normal 15-65); Free T4 1.64  Labs 12/26/14: HbA1c 5.6%, compared with 5.6% at last visit and 5.3% at the prior visit.   Labs 09/22/14: 25-OH vitamin D 26.7  Labs 05/22/14: TSH 1.922, free T4 1.40, free T3 2.9; 25-hydroxy vitamin D 26  Labs 11/30/13: TSH 4.445, free T4 1.30, free T3 2.4  Labs 03/23/13: TSH 6.644, free T4 1.30, free T3 2.5; 25-hydroxy Vitamin D 27; calcium 9.4;  estradiol 145.8,   Labs 12/28/12: TSH 4.18, free T4 1.5, free T3 2.4  Labs 07/22/12: HbA1c 5.6%, TSH 0.101, free T4 1.51, free T3 2.9 (on Synthroid 137  mcg/day)  ASSESSMENT:  1. Hypothyroid: Sheran's TFTs have fluctuated over time, so we have adjusted her Synthroid doses accordingly. She was euthyroid in August 2019 and is clinically euthyroid today. Since she did not repeat her lab tests prior to this visit, we will repeat them now.   2. Obesity, morbid: Her weight has increased by 8 pounds, mostly due to increased eating after being laid off.  3. Goiter: Her thyroid gland has shrunk back to normal size. The process of waxing and waning of thyroid gland size is c/w ongoing thyroiditis. 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  4. Hypertension: Her DBP is still a bit high. She needs to walk daily.       5. Dyspepsia: This problem seems to be better.     6. Pre-diabetes: Her A1c is higher, paralleling her weight gain. I offered to increase her metformin dose, but she declined.         7. Hashimoto's disease: Her thyroiditis is clinically quiescent today.   8. Oligomenorrhea: Resolved.   9. OSA, Fatigue/snoring,apnea: She is doing better with her C-pap machine.    10. Acanthosis, nigricans: This is about the same, indicating ongoing resistance to insulin and hyperinsulinemia due to her obesity. This problem could reverse over time if she loses enough fat weight.  12. Vitamin D deficiency disease: She is taking more vitamin D now. We need to re-check her vitamin D level.  13. Elevated hematocrit: She may have been somewhat dehydrated at her last lab draw, but could be trending toward polycythemia.. 14. Cervical neuropathy: This problem has recurred since stopping PT.  PT and traction may help.   15. Sciatica: This problem persists.    PLAN:  1. Diagnostic: HbA1c and CBG today. Repeat TFTs, vitamin D, CBC, CMP, iron today.  2. Therapeutic: Continue Synthroid at 200 mcg/day for 6 days  each week and 1/2 tablet on the 7th days; Resume metformin, 500 mg, twice daily; Continue  Vitamin D, 2000 IU per day. Try to exercise daily.  3. Patient Education: It is important to eat right and to exercise right when she can.  4. Follow-up: 4 months. If she can't afford any longer to come back to see me,  I offered to refer her to Dr. Casimiro NeedleMichael Altheimer, who [facility] [address]   Level of Service: This visit lasted in excess of 55 minutes. More than 50% of the visit was devoted to counseling.  Molli KnockMichael Brennan, MD, CDE Adult and Pediatric Endocrinology

## 2019-02-07 ENCOUNTER — Ambulatory Visit (INDEPENDENT_AMBULATORY_CARE_PROVIDER_SITE_OTHER): Payer: BLUE CROSS/BLUE SHIELD | Admitting: "Endocrinology

## 2019-02-07 ENCOUNTER — Other Ambulatory Visit: Payer: Self-pay

## 2019-02-07 ENCOUNTER — Encounter (INDEPENDENT_AMBULATORY_CARE_PROVIDER_SITE_OTHER): Payer: Self-pay | Admitting: "Endocrinology

## 2019-02-07 VITALS — BP 122/72 | HR 88 | Wt 269.2 lb

## 2019-02-07 DIAGNOSIS — E049 Nontoxic goiter, unspecified: Secondary | ICD-10-CM | POA: Diagnosis not present

## 2019-02-07 DIAGNOSIS — R7303 Prediabetes: Secondary | ICD-10-CM | POA: Diagnosis not present

## 2019-02-07 DIAGNOSIS — I10 Essential (primary) hypertension: Secondary | ICD-10-CM

## 2019-02-07 DIAGNOSIS — E063 Autoimmune thyroiditis: Secondary | ICD-10-CM | POA: Diagnosis not present

## 2019-02-07 DIAGNOSIS — M5431 Sciatica, right side: Secondary | ICD-10-CM

## 2019-02-07 DIAGNOSIS — R718 Other abnormality of red blood cells: Secondary | ICD-10-CM

## 2019-02-07 DIAGNOSIS — E559 Vitamin D deficiency, unspecified: Secondary | ICD-10-CM

## 2019-02-07 DIAGNOSIS — M5432 Sciatica, left side: Secondary | ICD-10-CM

## 2019-02-07 DIAGNOSIS — G542 Cervical root disorders, not elsewhere classified: Secondary | ICD-10-CM

## 2019-02-07 LAB — POCT GLYCOSYLATED HEMOGLOBIN (HGB A1C): Hemoglobin A1C: 5.7 % — AB (ref 4.0–5.6)

## 2019-02-07 LAB — POCT GLUCOSE (DEVICE FOR HOME USE): POC Glucose: 86 mg/dl (ref 70–99)

## 2019-02-07 NOTE — Patient Instructions (Signed)
Follow up visit in 6 months. 

## 2019-02-08 LAB — COMPREHENSIVE METABOLIC PANEL
AG Ratio: 1.3 (calc) (ref 1.0–2.5)
ALT: 27 U/L (ref 6–29)
AST: 23 U/L (ref 10–35)
Albumin: 4.3 g/dL (ref 3.6–5.1)
Alkaline phosphatase (APISO): 59 U/L (ref 31–125)
BUN: 11 mg/dL (ref 7–25)
CO2: 24 mmol/L (ref 20–32)
Calcium: 9.5 mg/dL (ref 8.6–10.2)
Chloride: 105 mmol/L (ref 98–110)
Creat: 0.84 mg/dL (ref 0.50–1.10)
Globulin: 3.2 g/dL (calc) (ref 1.9–3.7)
Glucose, Bld: 78 mg/dL (ref 65–139)
Potassium: 4.5 mmol/L (ref 3.5–5.3)
Sodium: 138 mmol/L (ref 135–146)
Total Bilirubin: 0.3 mg/dL (ref 0.2–1.2)
Total Protein: 7.5 g/dL (ref 6.1–8.1)

## 2019-02-08 LAB — CBC WITH DIFFERENTIAL/PLATELET
Absolute Monocytes: 614 cells/uL (ref 200–950)
Basophils Absolute: 71 cells/uL (ref 0–200)
Basophils Relative: 1.2 %
Eosinophils Absolute: 230 cells/uL (ref 15–500)
Eosinophils Relative: 3.9 %
HCT: 44.4 % (ref 35.0–45.0)
Hemoglobin: 14 g/dL (ref 11.7–15.5)
Lymphs Abs: 1564 cells/uL (ref 850–3900)
MCH: 26.8 pg — ABNORMAL LOW (ref 27.0–33.0)
MCHC: 31.5 g/dL — ABNORMAL LOW (ref 32.0–36.0)
MCV: 84.9 fL (ref 80.0–100.0)
MPV: 11.4 fL (ref 7.5–12.5)
Monocytes Relative: 10.4 %
Neutro Abs: 3422 cells/uL (ref 1500–7800)
Neutrophils Relative %: 58 %
Platelets: 221 10*3/uL (ref 140–400)
RBC: 5.23 10*6/uL — ABNORMAL HIGH (ref 3.80–5.10)
RDW: 13.2 % (ref 11.0–15.0)
Total Lymphocyte: 26.5 %
WBC: 5.9 10*3/uL (ref 3.8–10.8)

## 2019-02-08 LAB — VITAMIN D 25 HYDROXY (VIT D DEFICIENCY, FRACTURES): Vit D, 25-Hydroxy: 33 ng/mL (ref 30–100)

## 2019-02-08 LAB — TSH: TSH: 1.59 mIU/L

## 2019-02-08 LAB — IRON: Iron: 37 ug/dL — ABNORMAL LOW (ref 40–190)

## 2019-02-08 LAB — T3, FREE: T3, Free: 2.6 pg/mL (ref 2.3–4.2)

## 2019-02-08 LAB — T4, FREE: Free T4: 1.9 ng/dL — ABNORMAL HIGH (ref 0.8–1.8)

## 2019-02-14 ENCOUNTER — Telehealth (INDEPENDENT_AMBULATORY_CARE_PROVIDER_SITE_OTHER): Payer: Self-pay | Admitting: "Endocrinology

## 2019-02-14 ENCOUNTER — Other Ambulatory Visit (INDEPENDENT_AMBULATORY_CARE_PROVIDER_SITE_OTHER): Payer: Self-pay | Admitting: *Deleted

## 2019-02-14 MED ORDER — LEVOTHYROXINE SODIUM 200 MCG PO TABS: ORAL_TABLET | ORAL | 1 refills | Status: AC

## 2019-02-14 NOTE — Telephone Encounter (Signed)
Please call Ana Murphy with her resent lab results.  She is out of Synthroid and needs to know if Dr. Tobe Sos is changing her dosage.

## 2019-02-14 NOTE — Telephone Encounter (Signed)
Spoke to patient, Thyroid tests are normal. CMP is normal. Vitamin D is now normal, but in the low-normal end of the normal range. Iron is low. RBC count is slightly elevated, but hemoglobin and hematocrit are norma. MCH and MCHC are low, c/w iron deficiency. She needs to take a good MVI with iron, such as Centrum for Women or On e-A-Day for Women. Synthroid refills sent to pharmacy. Patient voiced understanding.

## 2019-02-14 NOTE — Telephone Encounter (Signed)
Dr. Tobe Sos has not resulted labs and he is out til tomorrow. I will route this note to Dr. Tobe Sos. Patient advises she took her last pill this am.  Please result and I will call her back.

## 2019-02-24 ENCOUNTER — Encounter: Payer: Self-pay | Admitting: "Endocrinology

## 2019-02-24 LAB — HM DIABETES EYE EXAM

## 2019-06-28 ENCOUNTER — Other Ambulatory Visit (INDEPENDENT_AMBULATORY_CARE_PROVIDER_SITE_OTHER): Payer: Self-pay | Admitting: "Endocrinology

## 2019-08-08 ENCOUNTER — Telehealth (INDEPENDENT_AMBULATORY_CARE_PROVIDER_SITE_OTHER): Payer: Self-pay | Admitting: "Endocrinology

## 2019-08-08 DIAGNOSIS — E063 Autoimmune thyroiditis: Secondary | ICD-10-CM

## 2019-08-08 DIAGNOSIS — R7303 Prediabetes: Secondary | ICD-10-CM

## 2019-08-08 NOTE — Telephone Encounter (Signed)
  Who's calling (name and relationship to patient) : Ana Murphy, self  Best contact number:  [phone]  Provider they see: Tobe Sos  Reason for call: [name] called in for an estimate on cost for visit on 08/09/19. She is aware her BCBS is out of network and so I explained she would be responsible for the full amount of visit and that unfortunately discounts can not be applied. She would like Dr. Tobe Sos to refer her to an In Network provider. Provider must be within the [facility] network. Please call to discuss, she may cancel her upcoming appointment.      PRESCRIPTION REFILL ONLY  Name of prescription:  Pharmacy:

## 2019-08-08 NOTE — Telephone Encounter (Signed)
TC to patient to advise that appointment was cancelled for tomorrow since she out of Network with Tobe Sos, he will call her later to advise of the provider he will refer her to.

## 2019-08-08 NOTE — Telephone Encounter (Signed)
Who's calling (name and relationship to patient) : Ana Murphy (self)  Best contact number: [phone]  Provider they see: Dr. Tobe Sos  Reason for call:  PT is not in network for our practice, each visit is $390. She is wanting to cancel appointment tomorrow, PT had spoke with Dr. Tobe Sos regarding this before and he was going to recommend his endo doctor for that is in her network. Please advise patient with this information at earliest convenience.   Call ID:      PRESCRIPTION REFILL ONLY  Name of prescription:  Pharmacy:

## 2019-08-09 ENCOUNTER — Ambulatory Visit (INDEPENDENT_AMBULATORY_CARE_PROVIDER_SITE_OTHER): Payer: BLUE CROSS/BLUE SHIELD | Admitting: "Endocrinology

## 2019-08-09 NOTE — Telephone Encounter (Signed)
Call to patient advised per Dr. Tobe Sos the physician he would recommend is Dr. Elyse Hsu Phone number [phone] advised we will have to send them a referral and then they will contact her or she can contact them. She agrees with plan and notes will be sent to their office.

## 2019-08-11 ENCOUNTER — Ambulatory Visit (INDEPENDENT_AMBULATORY_CARE_PROVIDER_SITE_OTHER): Payer: BLUE CROSS/BLUE SHIELD | Admitting: "Endocrinology

## 2020-02-14 ENCOUNTER — Telehealth: Payer: Self-pay | Admitting: Internal Medicine

## 2020-02-14 NOTE — Telephone Encounter (Signed)
Spoke with patient and verified that her CPAP machine was lost with her luggage on her way to [place].  She has been able to rent a CPAP machine and needed the setting to give to her pulmonologist in [place].  I provided her with the Auto 4-15 setting.  She verbalized understanding.  She will let us know if her CPAP is not found with her luggage.  Nothing further needed.

## 2020-02-19 ENCOUNTER — Other Ambulatory Visit (INDEPENDENT_AMBULATORY_CARE_PROVIDER_SITE_OTHER): Payer: Self-pay | Admitting: "Endocrinology

## 2020-03-10 ENCOUNTER — Other Ambulatory Visit (INDEPENDENT_AMBULATORY_CARE_PROVIDER_SITE_OTHER): Payer: Self-pay | Admitting: "Endocrinology

## 2020-03-18 ENCOUNTER — Other Ambulatory Visit (INDEPENDENT_AMBULATORY_CARE_PROVIDER_SITE_OTHER): Payer: Self-pay | Admitting: "Endocrinology

## 2022-03-13 ENCOUNTER — Encounter (INDEPENDENT_AMBULATORY_CARE_PROVIDER_SITE_OTHER): Payer: Self-pay
